# Patient Record
Sex: Male | Born: 1960 | Race: White | Hispanic: No | Marital: Single | Smoking: Never smoker
Health system: Southern US, Community
[De-identification: ages and names within clinical notes are randomized; demographics above are authoritative.]

## PROBLEM LIST (undated history)

## (undated) DIAGNOSIS — I10 Essential (primary) hypertension: Secondary | ICD-10-CM

## (undated) DIAGNOSIS — R000 Tachycardia, unspecified: Secondary | ICD-10-CM

## (undated) DIAGNOSIS — E119 Type 2 diabetes mellitus without complications: Secondary | ICD-10-CM

## (undated) HISTORY — PX: MENISCUS REPAIR: SHX5179

## (undated) HISTORY — PX: HERNIA REPAIR: SHX51

---

## 2005-01-29 ENCOUNTER — Emergency Department: Payer: Self-pay | Admitting: General Practice

## 2005-01-31 ENCOUNTER — Emergency Department: Payer: Self-pay | Admitting: Internal Medicine

## 2005-02-02 ENCOUNTER — Emergency Department: Payer: Self-pay | Admitting: Emergency Medicine

## 2006-11-28 ENCOUNTER — Other Ambulatory Visit: Payer: Self-pay

## 2006-11-29 ENCOUNTER — Inpatient Hospital Stay: Payer: Self-pay | Admitting: Internal Medicine

## 2008-02-12 ENCOUNTER — Ambulatory Visit (HOSPITAL_BASED_OUTPATIENT_CLINIC_OR_DEPARTMENT_OTHER): Admission: RE | Admit: 2008-02-12 | Discharge: 2008-02-12 | Payer: Self-pay | Admitting: Orthopedic Surgery

## 2010-02-09 ENCOUNTER — Emergency Department: Payer: Self-pay | Admitting: Emergency Medicine

## 2010-02-22 ENCOUNTER — Ambulatory Visit: Payer: Self-pay | Admitting: Orthopedic Surgery

## 2010-08-29 ENCOUNTER — Encounter: Admission: RE | Admit: 2010-08-29 | Discharge: 2010-08-29 | Payer: Self-pay | Admitting: Occupational Medicine

## 2011-05-07 NOTE — Op Note (Signed)
Patrick Ramos, Patrick Ramos              ACCOUNT NO.:  [id number]   MEDICAL RECORD NO.:  [id number]          PATIENT TYPE:  AMB   LOCATION:  DSC                          FACILITY:  [facility]   PHYSICIAN:  Harvie Junior, M.D.   DATE OF BIRTH:  1961/06/06   DATE OF PROCEDURE:  02/12/2008  DATE OF DISCHARGE:  02/12/2008                               OPERATIVE REPORT   This is a 50 year old male on Orthopedic Surgery service.   PREOPERATIVE DIAGNOSIS:  Suspected chondral injury, medial.   POSTOPERATIVE DIAGNOSES:  1. Lateral meniscal tear.  2. Chondral injury patellofemoral joint.   PRINCIPAL PROCEDURES:  1. Arthroscopic debridement of partial lateral meniscus.  2. Arthroscopic chondroplasty of patellofemoral joint.  3. Dramatic wash-out with 6 liters of saline.   SURGEON:  Jodi Geralds, MD.   ASSISTANT:  Marshia Ly, PA.   ANESTHESIA:  General.   BRIEF HISTORY:  Patrick Ramos is a 50 year old male with a long history of  having had significant left knee pain.  He had been treated  conservatively for a period of time.  He had injection therapy.  He had  anti-inflammatory medication.  These things had helped but he persisted  with having pain.  He had a previous knee scope which showed some  cartilage irritation and inflammation.  We evaluated him in the office.  X-rays showed good maintenance of joint space.  We felt that he could  have a little bit of chondral injury relative to his previous knee scope  and we felt that it was possible he could have meniscal pathology,  certainly felt that he had significant synovial problems.  He is brought  to the operating room for operative knee arthroscopy after failure of  all conservative care.   PROCEDURE:  The patient taken to the operating room.  After adequate  anesthesia obtained with general anesthetic, the patient placed upon the  operating room table.  He was then placed into a leg holder and the left  leg was prepped and draped in the  usual sterile fashion.  Following this  routine arthroscopic examination of the joint revealed that the medial  compartment showed no significant meniscal abnormality and the medial  femoral cartilage was probed at length, tibial plateau probed at length  and all of this was good.  He did have a significant amount of  crystalline deposition in the knee, this was debrided with a probe and  suctioned out during the case.  Attention was turned to the lateral  compartment.  He did have a little undersurface lateral meniscal tear  which was debrided with a straight-biting forceps.  The remaining  meniscal remnant was contoured down with a suction shaver.  The lateral  compartment had a fair amount of crystalline deposition as well.  Lateral femoral cartilage looked good to probing and to range of motion  and also lateral tibial plateau looked good to probing and range of  motion.  Attention was then turned now to the lateral compartment to the  ACL which was normal.  Attention was turned now away from the ACL and  into  the patellofemoral compartment where the joint space looked good  and the knee at this point was copiously and thoroughly irrigated and  suctioned dry.  The 6L of normal saline were instilled into the knee for  debridement here.  The patellofemoral joint was evaluated, noted to have  midline patellar tracking, no hyperpressure and there was a little bit  of chondral injury at the superior surface of the trochlea which was  debrided by way of chondroplasty.  At this point the 6L of the saline  was washed through the knee with a probe, using to try to debride some  of the crystalline deposition, we went into medial and lateral gutters,  suprapatellar pouch and both the medial and lateral compartments of the  knee.  At this point, the knee was copiously and thoroughly irrigated  and suctioned dry, the portals were closed with a bandage.  A sterile  compressive dressing was applied.   Patient taken to Recovery where he  was noted to be satisfactory condition.   ESTIMATED BLOOD LOSS FOR PROCEDURE:  None.      Harvie Junior, M.D.  Electronically Signed     JLG/MEDQ  D:  02/12/2008  T:  02/12/2008  Job:  [id number]

## 2011-07-22 ENCOUNTER — Emergency Department: Payer: Self-pay | Admitting: Emergency Medicine

## 2011-09-16 LAB — BASIC METABOLIC PANEL
Chloride: 101
GFR calc Af Amer: >60
GFR calc non Af Amer: >60
Potassium: 4.3

## 2011-09-16 LAB — POCT HEMOGLOBIN-HEMACUE: Hemoglobin: 16.4

## 2011-09-19 ENCOUNTER — Emergency Department: Payer: Self-pay | Admitting: Internal Medicine

## 2012-08-21 ENCOUNTER — Ambulatory Visit: Payer: Self-pay | Admitting: Internal Medicine

## 2012-08-23 ENCOUNTER — Ambulatory Visit: Payer: Self-pay | Admitting: Internal Medicine

## 2013-03-01 ENCOUNTER — Ambulatory Visit: Payer: Self-pay | Admitting: Unknown Physician Specialty

## 2014-02-28 ENCOUNTER — Ambulatory Visit: Payer: Self-pay

## 2016-01-26 ENCOUNTER — Other Ambulatory Visit: Payer: Self-pay | Admitting: Internal Medicine

## 2016-01-26 DIAGNOSIS — R1012 Left upper quadrant pain: Secondary | ICD-10-CM

## 2016-01-31 ENCOUNTER — Ambulatory Visit
Admission: RE | Admit: 2016-01-31 | Discharge: 2016-01-31 | Disposition: A | Discharge status: Home / Self Care | Payer: PRIVATE HEALTH INSURANCE | Source: Ambulatory Visit | Attending: Internal Medicine | Admitting: Internal Medicine

## 2016-01-31 DIAGNOSIS — R1012 Left upper quadrant pain: Secondary | ICD-10-CM | POA: Diagnosis present

## 2016-01-31 DIAGNOSIS — R194 Change in bowel habit: Secondary | ICD-10-CM | POA: Diagnosis not present

## 2016-01-31 DIAGNOSIS — K760 Fatty (change of) liver, not elsewhere classified: Secondary | ICD-10-CM | POA: Diagnosis not present

## 2016-01-31 HISTORY — DX: Essential (primary) hypertension: I10

## 2016-01-31 HISTORY — DX: Type 2 diabetes mellitus without complications: E11.9

## 2016-01-31 MED ORDER — IOHEXOL 300 MG/ML  SOLN
100.0000 mL | Freq: Once | INTRAMUSCULAR | Status: AC | PRN
  Administered 2016-01-31: 100 mL via INTRAVENOUS

## 2016-02-08 ENCOUNTER — Other Ambulatory Visit: Payer: Self-pay | Admitting: Nurse Practitioner

## 2016-02-08 DIAGNOSIS — K760 Fatty (change of) liver, not elsewhere classified: Secondary | ICD-10-CM

## 2016-02-12 ENCOUNTER — Ambulatory Visit
Admission: RE | Admit: 2016-02-12 | Discharge: 2016-02-12 | Disposition: A | Discharge status: Home / Self Care | Payer: PRIVATE HEALTH INSURANCE | Source: Ambulatory Visit | Attending: Nurse Practitioner | Admitting: Nurse Practitioner

## 2016-02-12 DIAGNOSIS — K760 Fatty (change of) liver, not elsewhere classified: Secondary | ICD-10-CM

## 2016-10-24 IMAGING — US US LIVER ELASTOGRAPHY
1 series · 13 of 16 positions shown · non-contrast
Comparison: None.

CLINICAL DATA: Fatty liver

EXAM:
ULTRASOUND HEPATIC ELASTOGRAPHY
TECHNIQUE: Ultrasound elastography evaluation of the liver was performed. A
region of interest was placed in the right lobe of the liver.
Following application of a compressive sonographic pulse, shear
waves were detected in the adjacent hepatic tissue and the shear
wave velocity was calculated. Multiple assessments were performed at
the selected site. Median shear wave velocity is correlated to a
Metavir fibrosis score.

[Series 1: us liver elastography · 0.27mm/px · 13 of 16 slices shown]
[im 1/16]
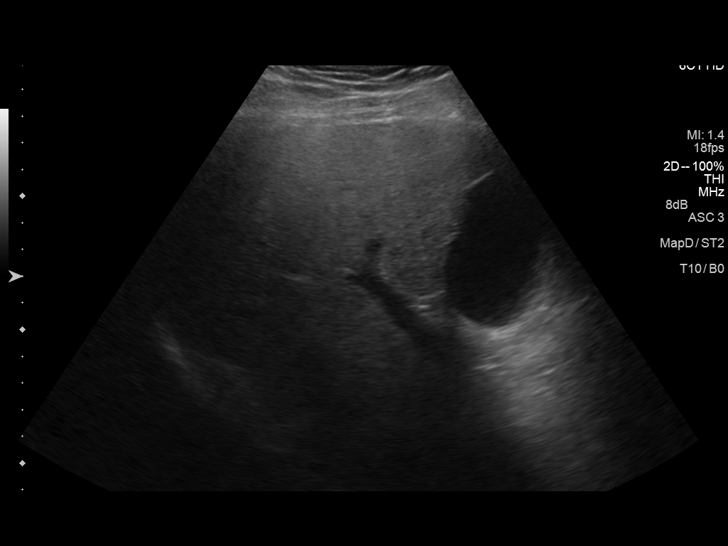
[im 2/16]
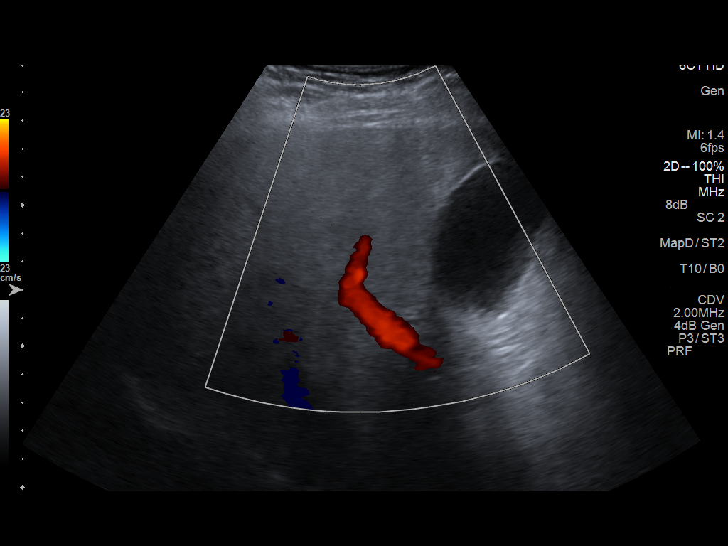
[im 4/16]
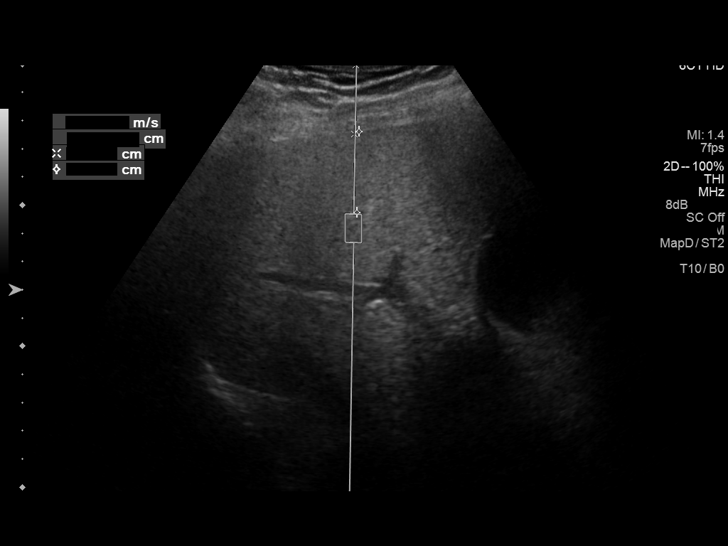
[im 5/16]
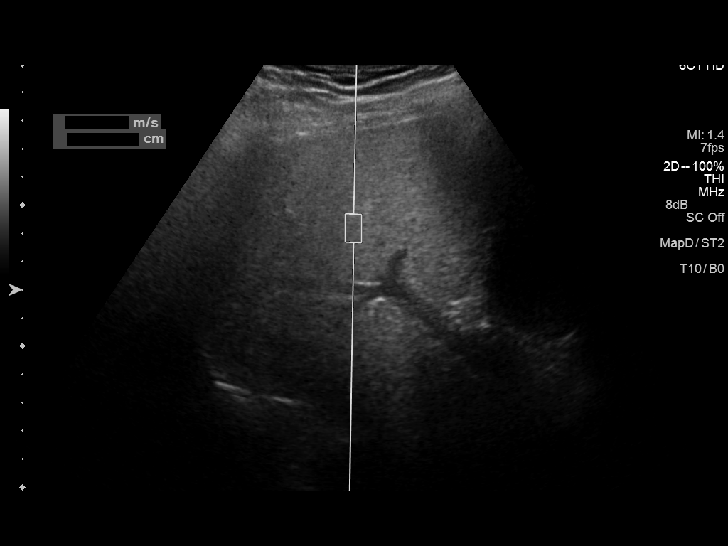
[im 6/16]
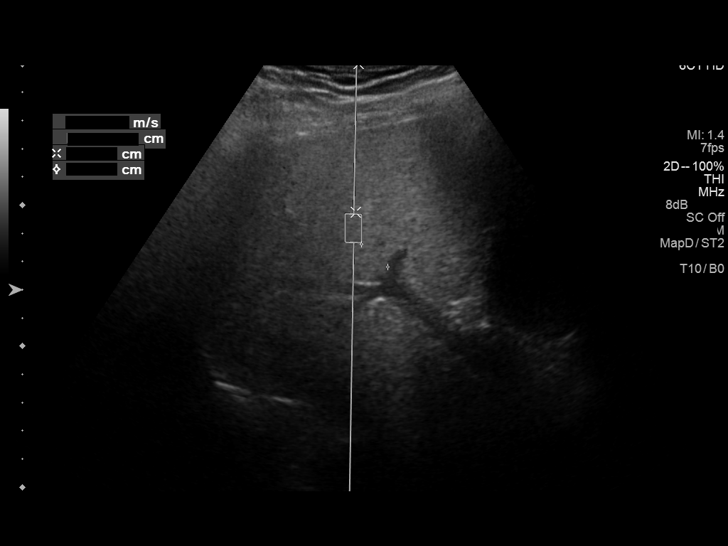
[im 7/16]
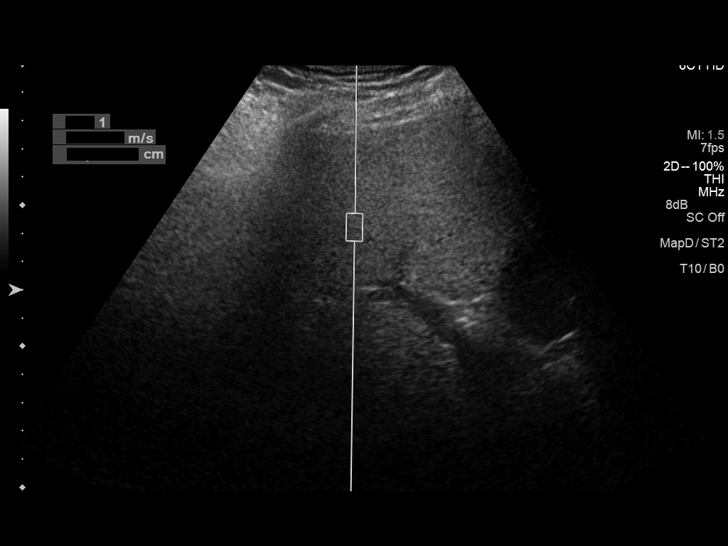
[im 9/16]
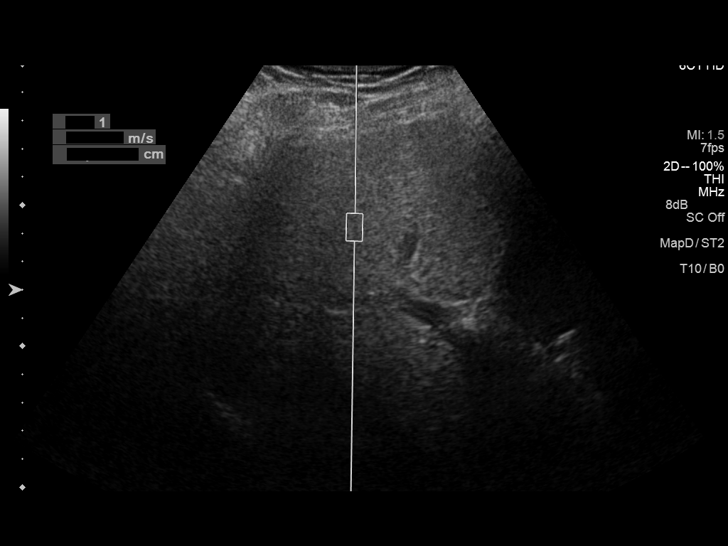
[im 10/16]
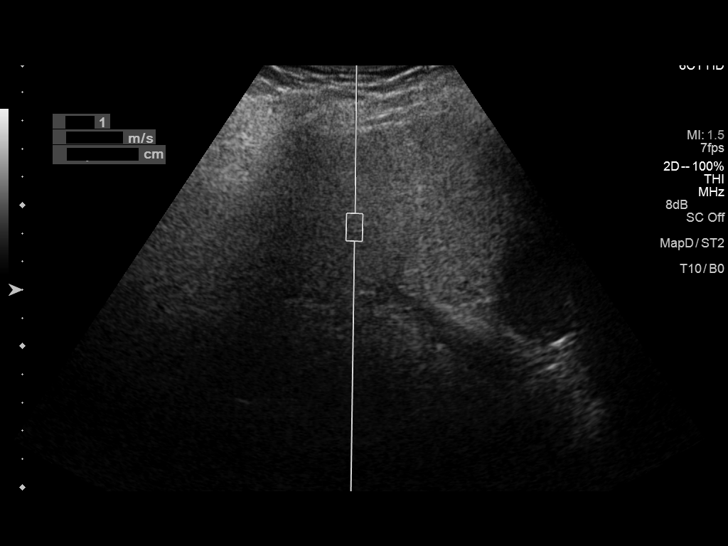
[im 11/16]
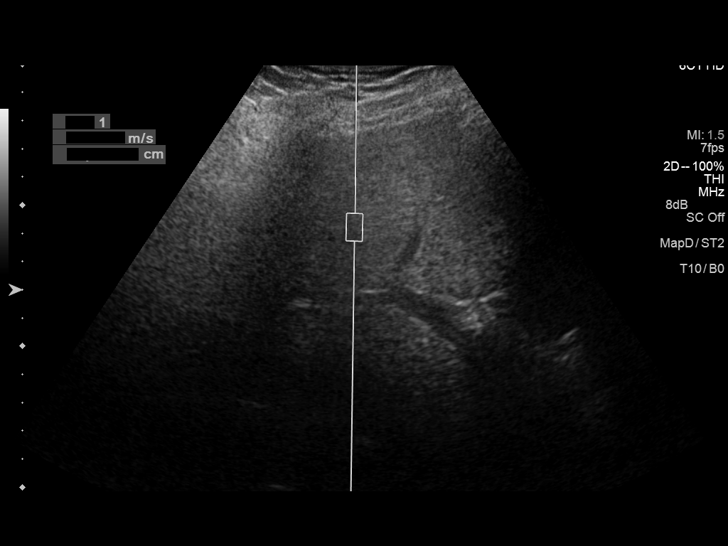
[im 12/16]
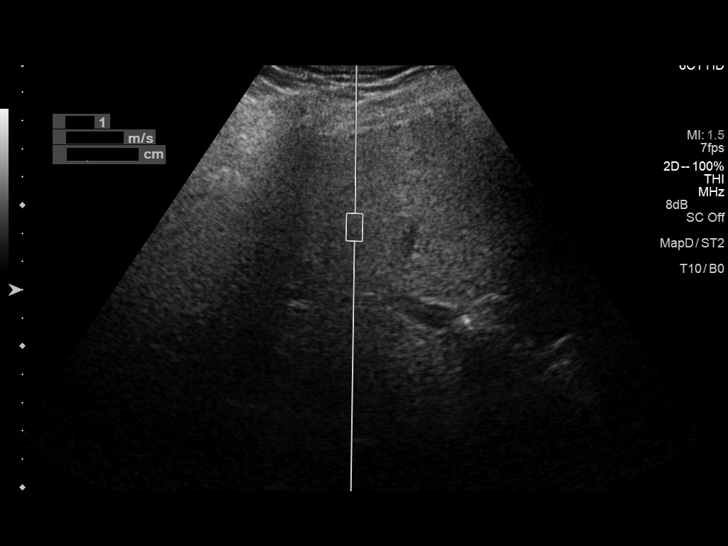
[im 13/16]
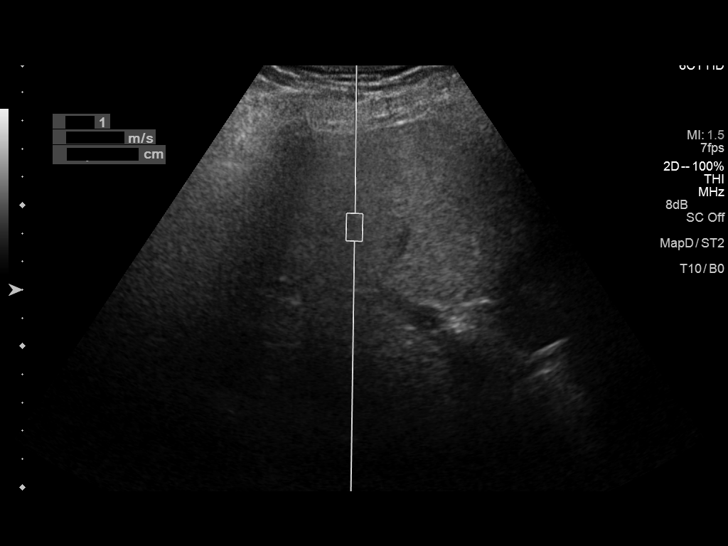
[im 15/16]
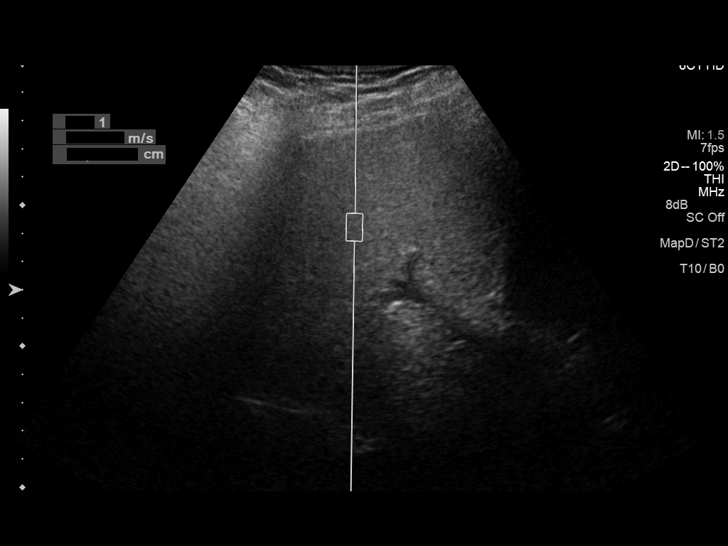
[im 16/16]
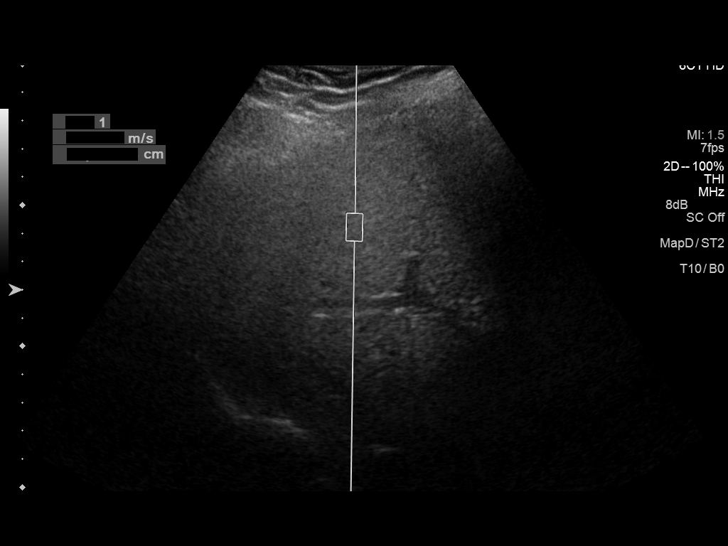

[13 of 16 positions shown; findings below may reference images not displayed]

FINDINGS: Device: Siemens Helix VTQ

Patient position: Supine

Transducer [id number]

Number of measurements: 10

Hepatic segment:  8

Median velocity:   3.12  m/sec

IQR:

IQR/Median velocity ratio:

Corresponding Metavir fibrosis score:  Some F3 + F4

Risk of fibrosis: High

Limitations of exam: Respiratory motion.

Pertinent findings noted on other imaging exams:  None

Please note that abnormal shear wave velocities may also be
identified in clinical settings other than with hepatic fibrosis,
such as: acute hepatitis, elevated right heart and central venous
pressures including use of beta blockers, Uest disease
([place]), infiltrative processes such as
mastocytosis/amyloidosis/infiltrative tumor, extrahepatic
cholestasis, in the post-prandial state, and liver transplantation.
Correlation with patient history, laboratory data, and clinical
condition recommended.
IMPRESSION: Median hepatic shear wave velocity is calculated at 3.12 m/sec.

Corresponding Metavir fibrosis score is  Some F3 + F4.

Risk of fibrosis is High.

Follow-up: Follow up advised.  This

## 2018-05-05 ENCOUNTER — Emergency Department
Admission: EM | Admit: 2018-05-05 | Discharge: 2018-05-05 | Disposition: A | Discharge status: Home / Self Care | Payer: BLUE CROSS/BLUE SHIELD | Attending: Emergency Medicine | Admitting: Emergency Medicine

## 2018-05-05 DIAGNOSIS — Z7984 Long term (current) use of oral hypoglycemic drugs: Secondary | ICD-10-CM | POA: Insufficient documentation

## 2018-05-05 DIAGNOSIS — I10 Essential (primary) hypertension: Secondary | ICD-10-CM | POA: Diagnosis not present

## 2018-05-05 DIAGNOSIS — R739 Hyperglycemia, unspecified: Secondary | ICD-10-CM

## 2018-05-05 DIAGNOSIS — E1165 Type 2 diabetes mellitus with hyperglycemia: Secondary | ICD-10-CM | POA: Diagnosis not present

## 2018-05-05 HISTORY — DX: Tachycardia, unspecified: R00.0

## 2018-05-05 LAB — URINALYSIS, COMPLETE (UACMP) WITH MICROSCOPIC
Bacteria, UA: NONE SEEN
Bilirubin Urine: NEGATIVE
Glucose, UA: >=500 mg/dL — AB
Hgb urine dipstick: NEGATIVE
KETONES UR: NEGATIVE mg/dL
LEUKOCYTES UA: NEGATIVE
Nitrite: NEGATIVE
PH: 7 (ref 5.0–8.0)
Protein, ur: NEGATIVE mg/dL
SQUAMOUS EPITHELIAL / LPF: NONE SEEN (ref 0–5)
Specific Gravity, Urine: 1.001 — ABNORMAL LOW (ref 1.005–1.030)
WBC, UA: NONE SEEN WBC/hpf (ref 0–5)

## 2018-05-05 LAB — BASIC METABOLIC PANEL
Anion gap: 7 (ref 5–15)
BUN: 11 mg/dL (ref 6–20)
CHLORIDE: 99 mmol/L — AB (ref 101–111)
CO2: 25 mmol/L (ref 22–32)
CREATININE: 0.91 mg/dL (ref 0.61–1.24)
Calcium: 8.7 mg/dL — ABNORMAL LOW (ref 8.9–10.3)
GFR calc Af Amer: >60 mL/min (ref >60)
GFR calc non Af Amer: >60 mL/min (ref >60)
GLUCOSE: 438 mg/dL — AB (ref 65–99)
POTASSIUM: 4.3 mmol/L (ref 3.5–5.1)
SODIUM: 131 mmol/L — AB (ref 135–145)

## 2018-05-05 LAB — GLUCOSE, CAPILLARY
GLUCOSE-CAPILLARY: 405 mg/dL — AB (ref 65–99)
GLUCOSE-CAPILLARY: 205 mg/dL — AB (ref 65–99)

## 2018-05-05 LAB — CBC
HEMATOCRIT: 45.4 % (ref 40.0–52.0)
Hemoglobin: 15.9 g/dL (ref 13.0–18.0)
MCH: 33.3 pg (ref 26.0–34.0)
MCHC: 35 g/dL (ref 32.0–36.0)
MCV: 95.4 fL (ref 80.0–100.0)
PLATELETS: 153 10*3/uL (ref 150–440)
RBC: 4.76 MIL/uL (ref 4.40–5.90)
RDW: 13.8 % (ref 11.5–14.5)
WBC: 7.1 10*3/uL (ref 3.8–10.6)

## 2018-05-05 MED ORDER — SODIUM CHLORIDE 0.9 % IV BOLUS
1000.0000 mL | Freq: Once | INTRAVENOUS | Status: AC
  Administered 2018-05-05: 1000 mL via INTRAVENOUS

## 2018-05-05 NOTE — ED Triage Notes (Signed)
To ER c/o high blood sugars since yesterday. Type 2 diabetic. Pt alert and oriented X4, active, cooperative, pt in NAD. RR even and unlabored, color WNL.  Denies CP SOB confusion.

## 2018-05-05 NOTE — Discharge Instructions (Addendum)
Return to the emergency room for any new or worsening symptoms including nausea vomiting diarrhea, chest pain shortness of breath or if you worsen anyway.  Drink plenty of fluids, avoid excess carbohydrates and sugars, and follow closely with your doctor take your glucose medication as scribed.

## 2018-05-05 NOTE — ED Provider Notes (Addendum)
[facility] Emergency Department Provider Note  ____________________________________________   I have reviewed the triage vital signs and the nursing notes. Where available I have reviewed prior notes and, if possible and indicated, outside hospital notes.    HISTORY  Chief Complaint Hyperglycemia    HPI Patrick Ramos is a 57 y.o. male who presents today complaining of elevated blood sugar.  Patient has been on glyburide for years.  He has not had any change in stress as he does not regularly check his sugar, sometimes as he was a few times a week.  He has absolutely no symptoms otherwise, he just noticed that his sugar was high.  He denies chest pain short of breath nausea vomiting abdominal pain he denies foot or groin or other skin rash.  No dysuria he does tend to eat a little more frequently when the sugar is high he has no flank pain, he has no fevers no diarrhea, he has no exertional symptoms he has no chest pain he has absolutely no symptoms aside from the fact that he knows to sugar was high.  Does state that he is under increased stress recently because he lost his job a month ago he has no SI or HI.  He did have he thinks some extra food today.  Past Medical History:  Diagnosis Date  . Diabetes mellitus without complication (HCC)   . Hypertension   . Tachycardia     There are no active problems to display for this patient.   History reviewed. No pertinent surgical history.  Prior to Admission medications   Not on File    Allergies Oxycodone-acetaminophen  No family history on file.  Social History Social History   Tobacco Use  . Smoking status: Never Smoker  Substance Use Topics  . Alcohol use: Not Currently  . Drug use: Not on file    Review of Systems Constitutional: No fever/chills Eyes: No visual changes. ENT: No sore throat. No stiff neck no neck pain Cardiovascular: Denies chest pain. Respiratory: Denies shortness of  breath. Gastrointestinal:   no vomiting.  No diarrhea.  No constipation. Genitourinary: Negative for dysuria. Musculoskeletal: Negative lower extremity swelling Skin: Negative for rash. Neurological: Negative for severe headaches, focal weakness or numbness.   ____________________________________________   PHYSICAL EXAM:  VITAL SIGNS: ED Triage Vitals  Enc Vitals Group     BP 05/05/18 1553 (!) 143/76     Pulse Rate 05/05/18 1553 (!) 58     Resp 05/05/18 1553 18     Temp 05/05/18 1820 98.2 F (36.8 C)     Temp Source 05/05/18 1553 Oral     SpO2 05/05/18 1553 98 %     Weight 05/05/18 1553 175 lb (79.4 kg)     Height 05/05/18 1553  (1.676 m)     Head Circumference --      Peak Flow --      Pain Score 05/05/18 1553 0     Pain Loc --      Pain Edu? --      Excl. in GC? --     Constitutional: Alert and oriented. Well appearing and in no acute distress. Eyes: Conjunctivae are normal Head: Atraumatic HEENT: No congestion/rhinnorhea. Mucous membranes are moist.  Oropharynx non-erythematous Neck:   Nontender with no meningismus, no masses, no stridor Cardiovascular: Normal rate, regular rhythm. Grossly normal heart sounds.  Good peripheral circulation. Respiratory: Normal respiratory effort.  No retractions. Lungs CTAB. Abdominal: Soft and nontender. No  distention. No guarding no rebound Back:  There is no focal tenderness or step off.  there is no midline tenderness there are no lesions noted. there is no CVA tenderness Fournier's gangrene not present, normal GU Musculoskeletal: No lower extremity tenderness, no upper extremity tenderness. No joint effusions, no DVT signs strong distal pulses no edema Neurologic:  Normal speech and language. No gross focal neurologic deficits are appreciated.  Skin:  Skin is warm, dry and intact. No rash noted. Psychiatric: Mood and affect are normal. Speech and behavior are normal.  ____________________________________________    LABS (all labs ordered are listed, but only abnormal results are displayed)  Labs Reviewed  BASIC METABOLIC PANEL - Abnormal; Notable for the following components:      Result Value   Sodium 131 (*)    Chloride 99 (*)    Glucose, Bld 438 (*)    Calcium 8.7 (*)    All other components within normal limits  GLUCOSE, CAPILLARY - Abnormal; Notable for the following components:   Glucose-Capillary 405 (*)    All other components within normal limits  CBC  URINALYSIS, COMPLETE (UACMP) WITH MICROSCOPIC  CBG MONITORING, ED    Pertinent labs  results that were available during my care of the patient were reviewed by me and considered in my medical decision making (see chart for details). ____________________________________________  EKG  I personally interpreted any EKGs ordered by me or triage Sinus bradycardia rate 54 no acute ST elevation or depression, no significant axis deviation, normal EKG aside from mild bradycardia and borderline LVH. ____________________________________________  RADIOLOGY  Pertinent labs & imaging results that were available during my care of the patient were reviewed by me and considered in my medical decision making (see chart for details). If possible, patient and/or family made aware of any abnormal findings.  No results found. ____________________________________________    PROCEDURES  Procedure(s) performed: None  Procedures  Critical Care performed: None  ____________________________________________   INITIAL IMPRESSION / ASSESSMENT AND PLAN / ED COURSE  Pertinent labs & imaging results that were available during my care of the patient were reviewed by me and considered in my medical decision making (see chart for details).  Patient no acute distress here with elevated sugar, most likely this is stress-induced/diet related however we will give him a liter of fluid and we will check a urinalysis looking for signs of occult infection we  will do an EKG and if all is reassuring we will try to get him home.    ____________________________________________   FINAL CLINICAL IMPRESSION(S) / ED DIAGNOSES  Final diagnoses:  None      This chart was dictated using voice recognition software.  Despite best efforts to proofread,  errors can occur which can change meaning.      Jeanmarie Plant, MD 05/05/18 1851    Jeanmarie Plant, MD 05/05/18 [phone]

## 2018-05-07 ENCOUNTER — Encounter: Payer: Self-pay | Admitting: Emergency Medicine

## 2018-05-07 ENCOUNTER — Emergency Department
Admission: EM | Admit: 2018-05-07 | Discharge: 2018-05-07 | Disposition: A | Discharge status: Home / Self Care | Payer: BLUE CROSS/BLUE SHIELD | Attending: Emergency Medicine | Admitting: Emergency Medicine

## 2018-05-07 ENCOUNTER — Other Ambulatory Visit: Payer: Self-pay

## 2018-05-07 DIAGNOSIS — I10 Essential (primary) hypertension: Secondary | ICD-10-CM | POA: Insufficient documentation

## 2018-05-07 DIAGNOSIS — E1165 Type 2 diabetes mellitus with hyperglycemia: Secondary | ICD-10-CM | POA: Diagnosis not present

## 2018-05-07 DIAGNOSIS — R739 Hyperglycemia, unspecified: Secondary | ICD-10-CM

## 2018-05-07 DIAGNOSIS — K0889 Other specified disorders of teeth and supporting structures: Secondary | ICD-10-CM | POA: Insufficient documentation

## 2018-05-07 LAB — CBC
HCT: 46.2 % (ref 40.0–52.0)
HEMOGLOBIN: 16 g/dL (ref 13.0–18.0)
MCH: 32.8 pg (ref 26.0–34.0)
MCHC: 34.7 g/dL (ref 32.0–36.0)
MCV: 94.4 fL (ref 80.0–100.0)
Platelets: 149 10*3/uL — ABNORMAL LOW (ref 150–440)
RBC: 4.89 MIL/uL (ref 4.40–5.90)
RDW: 13.7 % (ref 11.5–14.5)
WBC: 7.6 10*3/uL (ref 3.8–10.6)

## 2018-05-07 LAB — BASIC METABOLIC PANEL
ANION GAP: 9 (ref 5–15)
BUN: 12 mg/dL (ref 6–20)
CALCIUM: 8.6 mg/dL — AB (ref 8.9–10.3)
CO2: 22 mmol/L (ref 22–32)
Chloride: 99 mmol/L — ABNORMAL LOW (ref 101–111)
Creatinine, Ser: 0.91 mg/dL (ref 0.61–1.24)
GFR calc Af Amer: >60 mL/min (ref >60)
GLUCOSE: 372 mg/dL — AB (ref 65–99)
Potassium: 4.1 mmol/L (ref 3.5–5.1)
Sodium: 130 mmol/L — ABNORMAL LOW (ref 135–145)

## 2018-05-07 LAB — URINALYSIS, COMPLETE (UACMP) WITH MICROSCOPIC
BACTERIA UA: NONE SEEN
BILIRUBIN URINE: NEGATIVE
Glucose, UA: >=500 mg/dL — AB
Hgb urine dipstick: NEGATIVE
KETONES UR: NEGATIVE mg/dL
Leukocytes, UA: NEGATIVE
NITRITE: NEGATIVE
PROTEIN: NEGATIVE mg/dL
Specific Gravity, Urine: 1.002 — ABNORMAL LOW (ref 1.005–1.030)
Squamous Epithelial / LPF: NONE SEEN (ref 0–5)
WBC UA: NONE SEEN WBC/hpf (ref 0–5)
pH: 6 (ref 5.0–8.0)

## 2018-05-07 LAB — GLUCOSE, CAPILLARY
Glucose-Capillary: 363 mg/dL — ABNORMAL HIGH (ref 65–99)
Glucose-Capillary: 289 mg/dL — ABNORMAL HIGH (ref 65–99)
Glucose-Capillary: 257 mg/dL — ABNORMAL HIGH (ref 65–99)

## 2018-05-07 LAB — TROPONIN I

## 2018-05-07 MED ORDER — PENICILLIN V POTASSIUM 500 MG PO TABS
500.0000 mg | ORAL_TABLET | Freq: Once | ORAL | Status: AC
  Administered 2018-05-07: 500 mg via ORAL
  Filled 2018-05-07: qty 2
  Filled 2018-05-07: qty 1

## 2018-05-07 MED ORDER — SODIUM CHLORIDE 0.9 % IV BOLUS
1000.0000 mL | Freq: Once | INTRAVENOUS | Status: AC
  Administered 2018-05-07: 1000 mL via INTRAVENOUS

## 2018-05-07 MED ORDER — GLIPIZIDE 10 MG PO TABS: 10.0000 mg | ORAL_TABLET | Freq: Two times a day (BID) | ORAL | 0 refills | Status: DC

## 2018-05-07 MED ORDER — PENICILLIN V POTASSIUM 500 MG PO TABS: 500.0000 mg | ORAL_TABLET | Freq: Four times a day (QID) | ORAL | 0 refills | Status: DC

## 2018-05-07 NOTE — ED Provider Notes (Signed)
[facility] Emergency Department Provider Note  Time seen: 7:40 PM  I have reviewed the triage vital signs and the nursing notes.   HISTORY  Chief Complaint Hyperglycemia    HPI JOB Patrick Ramos is a 57 y.o. male with a past medical history of diabetes, hypertension, tachycardia, presents to the emergency department for elevated blood glucose.  According to the patient for the past several weeks he has had a lot of difficulty controlling his blood glucose.  He takes his blood glucose several times each day and states for the past several weeks has been elevated in the 200s and 300 and now 400s.  Patient denies any abdominal pain, states in review of systems last week he had a brief episode of chest discomfort but has not had any since.  Denies any nausea, vomiting, diarrhea, dysuria or other infectious symptoms.  He does state mild right dental pain.  Past Medical History:  Diagnosis Date  . Diabetes mellitus without complication (HCC)   . Hypertension   . Tachycardia     There are no active problems to display for this patient.   History reviewed. No pertinent surgical history.  Prior to Admission medications   Not on File    Allergies  Allergen Reactions  . Oxycodone-Acetaminophen Other (See Comments)         No family history on file.  Social History Social History   Tobacco Use  . Smoking status: Never Smoker  . Smokeless tobacco: Never Used  Substance Use Topics  . Alcohol use: Not Currently  . Drug use: Never    Review of Systems Constitutional: Negative for fever. Eyes: Negative for visual complaints ENT: Mild right lower dental pain. Cardiovascular: Negative for chest pain. Respiratory: Negative for shortness of breath. Gastrointestinal: Negative for abdominal pain, vomiting and diarrhea. Genitourinary: Negative for urinary compaints Musculoskeletal: Negative for musculoskeletal complaints Skin: Negative for skin complaints   Neurological: Negative for headache All other ROS negative  ____________________________________________   PHYSICAL EXAM:  VITAL SIGNS: ED Triage Vitals  Enc Vitals Group     BP 05/07/18 1756 (!) 148/93     Pulse Rate 05/07/18 1756 (!) 56     Resp 05/07/18 1756 16     Temp 05/07/18 1756 98.2 F (36.8 C)     Temp Source 05/07/18 1756 Oral     SpO2 05/07/18 1756 99 %     Weight 05/07/18 1758 182 lb (82.6 kg)     Height 05/07/18 1758  (1.676 m)     Head Circumference --      Peak Flow --      Pain Score 05/07/18 1757 3     Pain Loc --      Pain Edu? --      Excl. in GC? --    Constitutional: Alert and oriented. Well appearing and in no distress. Eyes: Normal exam ENT   Head: Normocephalic and atraumatic.   Mouth/Throat: Mucous membranes are moist.  Poor dentition of the right lower molar, no sign of abscess. Cardiovascular: Normal rate, regular rhythm. No murmur Respiratory: Normal respiratory effort without tachypnea nor retractions. Breath sounds are clear  Gastrointestinal: Soft and nontender. No distention.  Musculoskeletal: Nontender with normal range of motion in all extremities.  Neurologic:  Normal speech and language. No gross focal neurologic deficits  Skin:  Skin is warm, dry and intact.  Psychiatric: Mood and affect are normal.   ____________________________________________    INITIAL IMPRESSION / ASSESSMENT AND PLAN /  ED COURSE  Pertinent labs & imaging results that were available during my care of the patient were reviewed by me and considered in my medical decision making (see chart for details).  Patient presents to the emergency department for elevated blood glucose.  Largely negative review of systems with largely normal exam.  I reviewed the patient's past primary care notes he was at one point taking glipizide 20 mg daily although the patient states currently he is only taking 10 mg daily.  This very likely could be the cause of the  patient's hyperglycemia is ineffective diabetic medication.  Patient also has some dental discomfort although no signs of abscess on examination could also be a cause of hyperglycemia.  We will cover with penicillin.  Patient is very diligent about taking his blood sugar several times per day.  I believe he would be a good candidate to increase his glipizide from 10 mg once daily to 10 mg twice daily I discussed with the patient taking it once before breakfast and once before dinner and making sure he checks his blood glucose before going to bed.  Patient agreeable to this plan of care.  The patient did mention a brief episode of chest discomfort lasting several minutes last week I have added on a troponin as a precaution.  Overall the patient appears well denies any chest pain currently no shortness of breath cough congestion nausea or diaphoresis.  The remainder the patient's blood work is largely within normal limits besides mild hyponatremia due to pseudohyponatremia of hyperglycemia.  Blood sugar is now down to 257.  We will discharge patient with glipizide twice daily follow-up with his primary care doctor for recheck/reevaluation.  Patient agreeable to this plan of care.  ____________________________________________   FINAL CLINICAL IMPRESSION(S) / ED DIAGNOSES  Upper glycemia Dental pain    Minna Antis, MD 05/07/18 2102

## 2018-05-07 NOTE — ED Triage Notes (Signed)
Pt in via POV with complaints of hyperglycemia and headache.  Pt seen here for same on Tuesday, unable to keep blood sugar below 300 since going home.  Pt reports being unable to get in with PCP due to outstanding bill.  Vitals WDL. NAD noted at this time.

## 2019-04-30 ENCOUNTER — Emergency Department
Admission: EM | Admit: 2019-04-30 | Discharge: 2019-04-30 | Disposition: A | Discharge status: Home / Self Care | Payer: No Typology Code available for payment source | Attending: Emergency Medicine | Admitting: Emergency Medicine

## 2019-04-30 ENCOUNTER — Encounter: Payer: Self-pay | Admitting: Emergency Medicine

## 2019-04-30 ENCOUNTER — Other Ambulatory Visit: Payer: Self-pay

## 2019-04-30 ENCOUNTER — Emergency Department: Payer: No Typology Code available for payment source

## 2019-04-30 DIAGNOSIS — E119 Type 2 diabetes mellitus without complications: Secondary | ICD-10-CM | POA: Insufficient documentation

## 2019-04-30 DIAGNOSIS — R1032 Left lower quadrant pain: Secondary | ICD-10-CM

## 2019-04-30 DIAGNOSIS — I10 Essential (primary) hypertension: Secondary | ICD-10-CM | POA: Insufficient documentation

## 2019-04-30 DIAGNOSIS — K5792 Diverticulitis of intestine, part unspecified, without perforation or abscess without bleeding: Secondary | ICD-10-CM | POA: Insufficient documentation

## 2019-04-30 DIAGNOSIS — Z7984 Long term (current) use of oral hypoglycemic drugs: Secondary | ICD-10-CM | POA: Insufficient documentation

## 2019-04-30 LAB — CBC
HCT: 46.7 % (ref 39.0–52.0)
Hemoglobin: 16.5 g/dL (ref 13.0–17.0)
MCH: 32 pg (ref 26.0–34.0)
MCHC: 35.3 g/dL (ref 30.0–36.0)
MCV: 90.5 fL (ref 80.0–100.0)
Platelets: 137 10*3/uL — ABNORMAL LOW (ref 150–400)
RBC: 5.16 MIL/uL (ref 4.22–5.81)
RDW: 12.9 % (ref 11.5–15.5)
WBC: 12.1 10*3/uL — ABNORMAL HIGH (ref 4.0–10.5)
nRBC: 0 % (ref 0.0–0.2)

## 2019-04-30 LAB — COMPREHENSIVE METABOLIC PANEL
ALT: 35 U/L (ref 0–44)
AST: 28 U/L (ref 15–41)
Albumin: 3.9 g/dL (ref 3.5–5.0)
Alkaline Phosphatase: 98 U/L (ref 38–126)
Anion gap: 11 (ref 5–15)
BUN: 15 mg/dL (ref 6–20)
CO2: 21 mmol/L — ABNORMAL LOW (ref 22–32)
Calcium: 8.7 mg/dL — ABNORMAL LOW (ref 8.9–10.3)
Chloride: 98 mmol/L (ref 98–111)
Creatinine, Ser: 0.96 mg/dL (ref 0.61–1.24)
GFR calc Af Amer: >60 mL/min (ref >60)
GFR calc non Af Amer: >60 mL/min (ref >60)
Glucose, Bld: 257 mg/dL — ABNORMAL HIGH (ref 70–99)
Potassium: 4.1 mmol/L (ref 3.5–5.1)
Sodium: 130 mmol/L — ABNORMAL LOW (ref 135–145)
Total Bilirubin: 2.4 mg/dL — ABNORMAL HIGH (ref 0.3–1.2)
Total Protein: 8.2 g/dL — ABNORMAL HIGH (ref 6.5–8.1)

## 2019-04-30 LAB — URINALYSIS, COMPLETE (UACMP) WITH MICROSCOPIC
Bacteria, UA: NONE SEEN
Bilirubin Urine: NEGATIVE
Glucose, UA: >=500 mg/dL — AB
Hgb urine dipstick: NEGATIVE
Ketones, ur: 20 mg/dL — AB
Leukocytes,Ua: NEGATIVE
Nitrite: NEGATIVE
Protein, ur: NEGATIVE mg/dL
Specific Gravity, Urine: 1.028 (ref 1.005–1.030)
pH: 6 (ref 5.0–8.0)

## 2019-04-30 LAB — LIPASE, BLOOD: Lipase: 26 U/L (ref 11–51)

## 2019-04-30 MED ORDER — IOHEXOL 300 MG/ML  SOLN
100.0000 mL | Freq: Once | INTRAMUSCULAR | Status: AC | PRN
  Administered 2019-04-30: 14:00:00 100 mL via INTRAVENOUS

## 2019-04-30 MED ORDER — CIPROFLOXACIN HCL 500 MG PO TABS: 500.0000 mg | ORAL_TABLET | Freq: Two times a day (BID) | ORAL | 0 refills | Status: DC

## 2019-04-30 MED ORDER — SODIUM CHLORIDE 0.9 % IV BOLUS: 1000.0000 mL | Freq: Once | INTRAVENOUS | Status: DC

## 2019-04-30 MED ORDER — ONDANSETRON HCL 4 MG/2ML IJ SOLN
4.0000 mg | Freq: Once | INTRAMUSCULAR | Status: AC
  Administered 2019-04-30: 4 mg via INTRAVENOUS
  Filled 2019-04-30: qty 2

## 2019-04-30 MED ORDER — MORPHINE SULFATE (PF) 4 MG/ML IV SOLN
4.0000 mg | Freq: Once | INTRAVENOUS | Status: AC
  Administered 2019-04-30: 4 mg via INTRAVENOUS
  Filled 2019-04-30: qty 1

## 2019-04-30 MED ORDER — METRONIDAZOLE 500 MG PO TABS: 500.0000 mg | ORAL_TABLET | Freq: Two times a day (BID) | ORAL | 0 refills | Status: DC

## 2019-04-30 NOTE — ED Provider Notes (Signed)
[facility] Emergency Department Provider Note  ____________________________________________  Time seen: Approximately 2:02 PM  I have reviewed the triage vital signs and the nursing notes.   HISTORY  Chief Complaint Abdominal Pain    HPI Patrick Ramos is a 58 y.o. male with a history of hypertension diabetes and diverticulitis who complains of left lower quadrant abdominal pain worsening over the past 2 days.  Constant, no aggravating or alleviating factors.  Radiates to the back.  Severe.  Denies dysuria frequency urgency hematuria.  No diarrhea constipation or vomiting.  No fevers chills or body aches.   He has had decreased oral intake over the past 2 days due to the pain.  Past Medical History:  Diagnosis Date  . Diabetes mellitus without complication (HCC)   . Hypertension   . Tachycardia      There are no active problems to display for this patient.    History reviewed. No pertinent surgical history.   Prior to Admission medications   Medication Sig Start Date End Date Taking? Authorizing Provider  glipiZIDE (GLUCOTROL) 10 MG tablet Take 1 tablet (10 mg total) by mouth 2 (two) times daily. 05/07/18 05/07/19  Minna Antis, MD  penicillin v potassium (VEETID) 500 MG tablet Take 1 tablet (500 mg total) by mouth 4 (four) times daily. 05/07/18   Minna Antis, MD     Allergies Oxycodone-acetaminophen   No family history on file.  Social History Social History   Tobacco Use  . Smoking status: Never Smoker  . Smokeless tobacco: Never Used  Substance Use Topics  . Alcohol use: Not Currently  . Drug use: Never    Review of Systems  Constitutional:   No fever or chills.  ENT:   No sore throat. No rhinorrhea. Cardiovascular:   No chest pain or syncope. Respiratory:   No dyspnea or cough. Gastrointestinal: Positive as above for abdominal pain without vomiting and diarrhea.  Musculoskeletal:   Negative for focal pain or  swelling All other systems reviewed and are negative except as documented above in ROS and HPI.  ____________________________________________   PHYSICAL EXAM:  VITAL SIGNS: ED Triage Vitals [04/30/19 1223]  Enc Vitals Group     BP (!) 145/73     Pulse Rate 78     Resp 16     Temp 98.7 F (37.1 C)     Temp Source Oral     SpO2 96 %     Weight 165 lb (74.8 kg)     Height 5\' 6"  (1.676 m)     Head Circumference      Peak Flow      Pain Score 4     Pain Loc      Pain Edu?      Excl. in [place]?     Vital signs reviewed, nursing assessments reviewed.   Constitutional:   Alert and oriented. Non-toxic appearance. Eyes:   Conjunctivae are normal. EOMI. PERRL. ENT      Head:   Normocephalic and atraumatic.      Nose:   No congestion/rhinnorhea.       Mouth/Throat:   Dry mucous membranes, no pharyngeal erythema. No peritonsillar mass.       Neck:   No meningismus. Full ROM. Hematological/Lymphatic/Immunilogical:   No cervical lymphadenopathy. Cardiovascular:   RRR. Symmetric bilateral radial and DP pulses.  No murmurs. Cap refill less than 2 seconds. Respiratory:   Normal respiratory effort without tachypnea/retractions. Breath sounds are clear and equal bilaterally. No  wheezes/rales/rhonchi. Gastrointestinal:   Soft with diffuse left-sided abdominal tenderness with guarding. Non distended. There is no CVA tenderness.  No rebound or rigidity. Musculoskeletal:   Normal range of motion in all extremities. No joint effusions.  No lower extremity tenderness.  No edema. Neurologic:   Normal speech and language.  Motor grossly intact. No acute focal neurologic deficits are appreciated.  Skin:    Skin is warm, dry and intact. No rash noted.  No petechiae, purpura, or bullae.  ____________________________________________    LABS (pertinent positives/negatives) (all labs ordered are listed, but only abnormal results are displayed) Labs Reviewed  COMPREHENSIVE METABOLIC PANEL - Abnormal;  Notable for the following components:      Result Value   Sodium 130 (*)    CO2 21 (*)    Glucose, Bld 257 (*)    Calcium 8.7 (*)    Total Protein 8.2 (*)    Total Bilirubin 2.4 (*)    All other components within normal limits  CBC - Abnormal; Notable for the following components:   WBC 12.1 (*)    Platelets 137 (*)    All other components within normal limits  URINALYSIS, COMPLETE (UACMP) WITH MICROSCOPIC - Abnormal; Notable for the following components:   Color, Urine YELLOW (*)    APPearance CLEAR (*)    Glucose, UA >=500 (*)    Ketones, ur 20 (*)    All other components within normal limits  LIPASE, BLOOD   ____________________________________________   EKG    ____________________________________________    RADIOLOGY  No results found.  ____________________________________________   PROCEDURES Procedures  ____________________________________________  DIFFERENTIAL DIAGNOSIS   Diverticulitis, abdominal perforation, intra-abdominal abscess, bowel obstruction  CLINICAL IMPRESSION / ASSESSMENT AND PLAN / ED COURSE  Medications ordered in the ED: Medications  ondansetron (ZOFRAN) injection 4 mg (has no administration in time range)  morphine 4 MG/ML injection 4 mg (has no administration in time range)  sodium chloride 0.9 % bolus 1,000 mL (has no administration in time range)    Pertinent labs & imaging results that were available during my care of the patient were reviewed by me and considered in my medical decision making (see chart for details).  Patrick Ramos was evaluated in Emergency Department on 04/30/2019 for the symptoms described in the history of present illness. He was evaluated in the context of the global COVID-19 pandemic, which necessitated consideration that the patient might be at risk for infection with the SARS-CoV-2 virus that causes COVID-19. Institutional protocols and algorithms that pertain to the evaluation of patients at risk for  COVID-19 are in a state of rapid change based on information released by regulatory bodies including the CDC and federal and state organizations. These policies and algorithms were followed during the patient's care in the ED.   Diabetic patient presents with abdominal tenderness and guarding.  Worried about complicated diverticulitis primarily.  Will get CT scan of the abdomen and pelvis, give IV morphine 4 mg and IV Zofran 4 mg for initial symptom relief.  IV fluids for hydration.      ____________________________________________   FINAL CLINICAL IMPRESSION(S) / ED DIAGNOSES    Final diagnoses:  Left lower quadrant abdominal pain  Diverticulitis     ED Discharge Orders    None      Portions of this note were generated with dragon dictation software. Dictation errors may occur despite best attempts at proofreading.   Sharman CheekStafford, Harsimran Westman, MD 04/30/19 [phone]

## 2019-04-30 NOTE — ED Triage Notes (Signed)
Patient presents to the ED with severe left lower quadrant abdominal pain.  Patient reports history of diverticulitis but states this pain is much more severe.  Patient states pain started on Wednesday but has steadily increased.  Patient denies nausea and vomiting reports feeling somewhat constipated.  Patient reports slight tenderness on palpation.

## 2021-03-31 ENCOUNTER — Emergency Department: Payer: No Typology Code available for payment source

## 2021-03-31 ENCOUNTER — Other Ambulatory Visit: Payer: Self-pay

## 2021-03-31 ENCOUNTER — Emergency Department
Admission: EM | Admit: 2021-03-31 | Discharge: 2021-03-31 | Disposition: A | Discharge status: Home / Self Care | Payer: No Typology Code available for payment source | Attending: Emergency Medicine | Admitting: Emergency Medicine

## 2021-03-31 DIAGNOSIS — I10 Essential (primary) hypertension: Secondary | ICD-10-CM | POA: Diagnosis not present

## 2021-03-31 DIAGNOSIS — E119 Type 2 diabetes mellitus without complications: Secondary | ICD-10-CM | POA: Diagnosis not present

## 2021-03-31 DIAGNOSIS — Z7984 Long term (current) use of oral hypoglycemic drugs: Secondary | ICD-10-CM | POA: Insufficient documentation

## 2021-03-31 DIAGNOSIS — R1032 Left lower quadrant pain: Secondary | ICD-10-CM | POA: Diagnosis not present

## 2021-03-31 DIAGNOSIS — R197 Diarrhea, unspecified: Secondary | ICD-10-CM | POA: Diagnosis not present

## 2021-03-31 LAB — COMPREHENSIVE METABOLIC PANEL
ALT: 46 U/L — ABNORMAL HIGH (ref 0–44)
AST: 45 U/L — ABNORMAL HIGH (ref 15–41)
Albumin: 3.7 g/dL (ref 3.5–5.0)
Alkaline Phosphatase: 111 U/L (ref 38–126)
Anion gap: 9 (ref 5–15)
BUN: 12 mg/dL (ref 6–20)
CO2: 23 mmol/L (ref 22–32)
Calcium: 9.3 mg/dL (ref 8.9–10.3)
Chloride: 99 mmol/L (ref 98–111)
Creatinine, Ser: 0.83 mg/dL (ref 0.61–1.24)
GFR, Estimated: >60 mL/min (ref >60)
Glucose, Bld: 398 mg/dL — ABNORMAL HIGH (ref 70–99)
Potassium: 3.9 mmol/L (ref 3.5–5.1)
Sodium: 131 mmol/L — ABNORMAL LOW (ref 135–145)
Total Bilirubin: 1 mg/dL (ref 0.3–1.2)
Total Protein: 7.7 g/dL (ref 6.5–8.1)

## 2021-03-31 LAB — URINALYSIS, COMPLETE (UACMP) WITH MICROSCOPIC
Bacteria, UA: NONE SEEN
Bilirubin Urine: NEGATIVE
Glucose, UA: >=500 mg/dL — AB
Hgb urine dipstick: NEGATIVE
Ketones, ur: NEGATIVE mg/dL
Leukocytes,Ua: NEGATIVE
Nitrite: NEGATIVE
Protein, ur: NEGATIVE mg/dL
Specific Gravity, Urine: 1.031 — ABNORMAL HIGH (ref 1.005–1.030)
Squamous Epithelial / LPF: NONE SEEN (ref 0–5)
pH: 6 (ref 5.0–8.0)

## 2021-03-31 LAB — CBC
HCT: 41.4 % (ref 39.0–52.0)
Hemoglobin: 14.5 g/dL (ref 13.0–17.0)
MCH: 31.1 pg (ref 26.0–34.0)
MCHC: 35 g/dL (ref 30.0–36.0)
MCV: 88.8 fL (ref 80.0–100.0)
Platelets: 125 10*3/uL — ABNORMAL LOW (ref 150–400)
RBC: 4.66 MIL/uL (ref 4.22–5.81)
RDW: 12.8 % (ref 11.5–15.5)
WBC: 7.4 10*3/uL (ref 4.0–10.5)
nRBC: 0 % (ref 0.0–0.2)

## 2021-03-31 LAB — LIPASE, BLOOD: Lipase: 41 U/L (ref 11–51)

## 2021-03-31 MED ORDER — FENTANYL CITRATE (PF) 100 MCG/2ML IJ SOLN
50.0000 ug | Freq: Once | INTRAMUSCULAR | Status: AC
  Administered 2021-03-31: 50 ug via INTRAVENOUS
  Filled 2021-03-31: qty 2

## 2021-03-31 MED ORDER — ONDANSETRON HCL 4 MG/2ML IJ SOLN
4.0000 mg | Freq: Once | INTRAMUSCULAR | Status: AC
Start: 2021-03-31 — End: 2021-03-31
  Administered 2021-03-31: 4 mg via INTRAVENOUS
  Filled 2021-03-31: qty 2

## 2021-03-31 MED ORDER — IOHEXOL 300 MG/ML  SOLN
100.0000 mL | Freq: Once | INTRAMUSCULAR | Status: AC | PRN
  Administered 2021-03-31: 100 mL via INTRAVENOUS

## 2021-03-31 MED ORDER — LACTATED RINGERS IV BOLUS
1000.0000 mL | Freq: Once | INTRAVENOUS | Status: AC
  Administered 2021-03-31: 1000 mL via INTRAVENOUS

## 2021-03-31 NOTE — Discharge Instructions (Addendum)

## 2021-03-31 NOTE — ED Triage Notes (Signed)
LLQ abd pain with intermittent radiation to L flank. Hx of diverticulitis and states pain feels the same. Pt also reports recent treatment for diverticulitis flare and compliance with medications. Reports diarrhea without blood in stool.

## 2021-03-31 NOTE — ED Provider Notes (Signed)
[facility] Emergency Department Provider Note  ____________________________________________  Time seen: Approximately 6:38 AM  I have reviewed the triage vital signs and the nursing notes.   HISTORY  Chief Complaint Abdominal Pain   HPI Patrick Ramos is a 60 y.o. male with a history of diabetes, hypertension, diverticulitis who presents for evaluation of left lower quadrant abdominal pain.   Patient had similar symptoms 3 weeks ago.  Took a course of antibiotics with resolution of his symptoms.  Started having pain 2 days ago.  The pain is dull and constant located in the left lower quadrant radiating to the left flank.  He took a pain pill that was prescribed to him during the last episode and reports that the pain now has subsided.  He denies nausea, vomiting, constipation.  Has had some diarrhea with no blood or melena.  No fever or chills.  Past Medical History:  Diagnosis Date  . Diabetes mellitus without complication ([place])   . Hypertension   . Tachycardia     There are no problems to display for this patient.   History reviewed. No pertinent surgical history.  Prior to Admission medications   Medication Sig Start Date End Date Taking? Authorizing Provider  ciprofloxacin (CIPRO) 500 MG tablet Take 1 tablet (500 mg total) by mouth 2 (two) times daily. 04/30/19   Carrie Mew, MD  glipiZIDE (GLUCOTROL) 10 MG tablet Take 1 tablet (10 mg total) by mouth 2 (two) times daily. 05/07/18 05/07/19  Harvest Dark, MD  metroNIDAZOLE (FLAGYL) 500 MG tablet Take 1 tablet (500 mg total) by mouth 2 (two) times daily. 04/30/19   Carrie Mew, MD  penicillin v potassium (VEETID) 500 MG tablet Take 1 tablet (500 mg total) by mouth 4 (four) times daily. 05/07/18   Harvest Dark, MD    Allergies Oxycodone-acetaminophen  History reviewed. No pertinent family history.  Social History Social History   Tobacco Use  . Smoking status: Never Smoker   . Smokeless tobacco: Never Used  Vaping Use  . Vaping Use: Never used  Substance Use Topics  . Alcohol use: Not Currently  . Drug use: Never    Review of Systems  Constitutional: Negative for fever. Eyes: Negative for visual changes. ENT: Negative for sore throat. Neck: No neck pain  Cardiovascular: Negative for chest pain. Respiratory: Negative for shortness of breath. Gastrointestinal: + abdominal pain and diarrhea. No vomiting  Genitourinary: Negative for dysuria. Musculoskeletal: Negative for back pain. Skin: Negative for rash. Neurological: Negative for headaches, weakness or numbness. Psych: No SI or HI  ____________________________________________   PHYSICAL EXAM:  VITAL SIGNS: ED Triage Vitals  Enc Vitals Group     BP 03/31/21 0103 (!) 150/81     Pulse Rate 03/31/21 0103 78     Resp 03/31/21 0103 18     Temp 03/31/21 0103 98.8 F (37.1 C)     Temp Source 03/31/21 0103 Oral     SpO2 03/31/21 0102 95 %     Weight 03/31/21 0103 155 lb (70.3 kg)     Height 03/31/21 0103 _0  (1.676 m)     Head Circumference --      Peak Flow --      Pain Score 03/31/21 0103 4     Pain Loc --      Pain Edu? --      Excl. in [place]? --     Constitutional: Alert and oriented. Well appearing and in no apparent distress. HEENT:  Head: Normocephalic and atraumatic.         Eyes: Conjunctivae are normal. Sclera is non-icteric.       Mouth/Throat: Mucous membranes are moist.       Neck: Supple with no signs of meningismus. Cardiovascular: Regular rate and rhythm. No murmurs, gallops, or rubs. 2+ symmetrical distal pulses are present in all extremities. No JVD. Respiratory: Normal respiratory effort. Lungs are clear to auscultation bilaterally.  Gastrointestinal: Soft, mild left lower quadrant tenderness, and non distended with positive bowel sounds. No rebound or guarding. Genitourinary:Bilateral testicles are descended with no tenderness to palpation, bilateral positive  cremasteric reflexes are present, no swelling or erythema of the scrotum. No evidence of inguinal hernia. Musculoskeletal:  No edema, cyanosis, or erythema of extremities. Neurologic: Normal speech and language. Face is symmetric. Moving all extremities. No gross focal neurologic deficits are appreciated. Skin: Skin is warm, dry and intact. No rash noted. Psychiatric: Mood and affect are normal. Speech and behavior are normal.  ____________________________________________   LABS (all labs ordered are listed, but only abnormal results are displayed)  Labs Reviewed  COMPREHENSIVE METABOLIC PANEL - Abnormal; Notable for the following components:      Result Value   Sodium 131 (*)    Glucose, Bld 398 (*)    AST 45 (*)    ALT 46 (*)    All other components within normal limits  CBC - Abnormal; Notable for the following components:   Platelets 125 (*)    All other components within normal limits  URINALYSIS, COMPLETE (UACMP) WITH MICROSCOPIC - Abnormal; Notable for the following components:   Color, Urine YELLOW (*)    APPearance CLEAR (*)    Specific Gravity, Urine 1.031 (*)    Glucose, UA >=500 (*)    All other components within normal limits  LIPASE, BLOOD   ____________________________________________  EKG  none  ____________________________________________  RADIOLOGY  I have personally reviewed the images performed during this visit and I agree with the Radiologist's read.   Interpretation by Radiologist:  CT ABDOMEN PELVIS W CONTRAST  Result Date: 03/31/2021 CLINICAL DATA:  60 year old male with history of left lower quadrant abdominal pain with intermittent radiation to the left flank. EXAM: CT ABDOMEN AND PELVIS WITH CONTRAST TECHNIQUE: Multidetector CT imaging of the abdomen and pelvis was performed using the standard protocol following bolus administration of intravenous contrast. CONTRAST:  176m OMNIPAQUE IOHEXOL 300 MG/ML  SOLN COMPARISON:  CT the abdomen and pelvis  04/30/2019. FINDINGS: Lower chest: Atherosclerotic calcification in the left main and left anterior descending coronary artery. Hepatobiliary: Liver has a shrunken appearance and nodular contour, indicative of underlying cirrhosis. No suspicious cystic or solid hepatic lesions are confidently identified. No intra or extrahepatic biliary ductal dilatation. Gallbladder is normal in appearance. Pancreas: No pancreatic mass. No pancreatic ductal dilatation. No pancreatic or peripancreatic fluid collections or inflammatory changes. Spleen: Unremarkable. Adrenals/Urinary Tract: Bilateral kidneys and adrenal glands are normal in appearance. No hydroureteronephrosis. Urinary bladder is normal in appearance. Stomach/Bowel: The appearance of the stomach is normal. There is no pathologic dilatation of small bowel or colon. Normal appendix. Vascular/Lymphatic: Aortic atherosclerosis, without evidence of aneurysm or dissection in the abdominal or pelvic vasculature. No lymphadenopathy noted in the abdomen or pelvis. Reproductive: Prostate gland and seminal vesicles are unremarkable in appearance. Other: No significant volume of ascites.  No pneumoperitoneum. Musculoskeletal: There are no aggressive appearing lytic or blastic lesions noted in the visualized portions of the skeleton. IMPRESSION: 1. No acute findings are noted in  the abdomen or pelvis to account for the patient's symptoms. 2. Hepatic cirrhosis. 3. Aortic atherosclerosis, in addition to left main and left anterior descending coronary artery disease. Please note that although the presence of coronary artery calcium documents the presence of coronary artery disease, the severity of this disease and any potential stenosis cannot be assessed on this non-gated CT examination. Assessment for potential risk factor modification, dietary therapy or pharmacologic therapy may be warranted, if clinically indicated. Electronically Signed   By: Vinnie Langton M.D.   On:  03/31/2021 06:45      ____________________________________________   PROCEDURES  Procedure(s) performed: None Procedures Critical Care performed:  None ____________________________________________   INITIAL IMPRESSION / ASSESSMENT AND PLAN / ED COURSE  60 y.o. male with a history of diabetes, hypertension, diverticulitis who presents for evaluation of left lower quadrant abdominal pain x 2 days associated with diarrhea.  After taking a pain pill at home his pain has resolved at this time but he does have mild tenderness in the left lower quadrant no rebound or guarding.  Has normal vital signs.  DDX diverticulitis versus colitis versus pyelonephritis versus kidney stones    labs showing no leukocytosis or anemia, stable mild thrombocytopenia.  UA with no signs of urinary tract infection or blood.  CMP showing hyperglycemia with glucose of 398 with no signs of DKA.  Mildly elevated LFTs but normal alk phos, T bili, lipase.  Patient is not tender on the right upper quadrant. CT generalized by me with no explanation for patient's pain, confirmed by radiology   Patient received IVF bolus, IV fentanyl and zofran for symptoms.  Patient remains without any pain.  Will discharge home with supportive care follow-up with PCP.  Recommended return to the emergency room for new or worsening abdominal pain or fever.       _____________________________________________ Please note:  Patient was evaluated in Emergency Department today for the symptoms described in the history of present illness. Patient was evaluated in the context of the global COVID-19 pandemic, which necessitated consideration that the patient might be at risk for infection with the SARS-CoV-2 virus that causes COVID-19. Institutional protocols and algorithms that pertain to the evaluation of patients at risk for COVID-19 are in a state of rapid change based on information released by regulatory bodies including the CDC and  federal and state organizations. These policies and algorithms were followed during the patient's care in the ED.  Some ED evaluations and interventions may be delayed as a result of limited staffing during the pandemic.   [place] Controlled Substance Database was reviewed by me. ____________________________________________   FINAL CLINICAL IMPRESSION(S) / ED DIAGNOSES   Final diagnoses:  Left lower quadrant abdominal pain      NEW MEDICATIONS STARTED DURING THIS VISIT:  ED Discharge Orders    None       Note:  This document was prepared using Dragon voice recognition software and may include unintentional dictation errors.    Alfred Levins, [place], MD 03/31/21 [phone]

## 2021-11-25 IMAGING — CT CT ABD-PELV W/ CM
2 of 5 series · 15 of 46 positions shown, 17 images · IV contrast (APPLIED)
Comparison: CT the abdomen and pelvis 04/30/2019.

CLINICAL DATA: 59-year-old male with history of left lower quadrant
abdominal pain with intermittent radiation to the left flank.

EXAM:
CT ABDOMEN AND PELVIS WITH CONTRAST
TECHNIQUE: Multidetector CT imaging of the abdomen and pelvis was performed
using the standard protocol following bolus administration of
intravenous contrast.
CONTRAST:  100mL OMNIPAQUE IOHEXOL 300 MG/ML  SOLN

[Series 2: routine abd/pel with · axial · 0.84mm/px · z∈[-979,-509]mm · 12 of 106 slices shown, 14 images]
[im 6/106  soft-tissue]
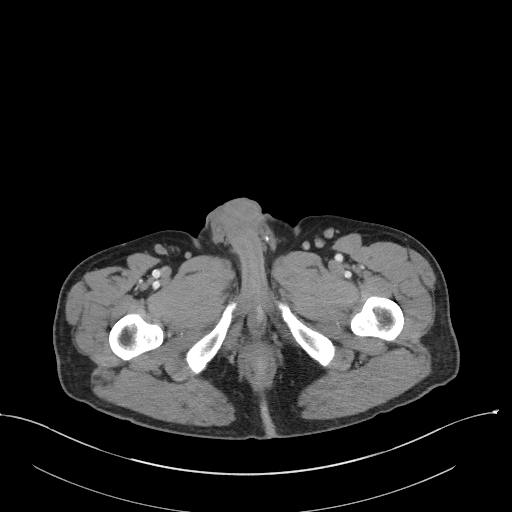
[im 6/106  bone]
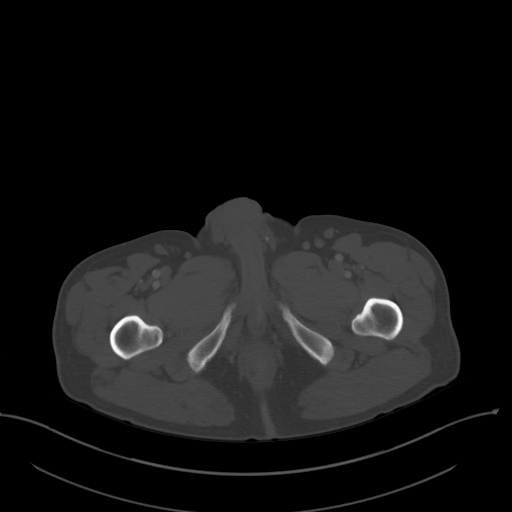
[im 17/106  soft-tissue]
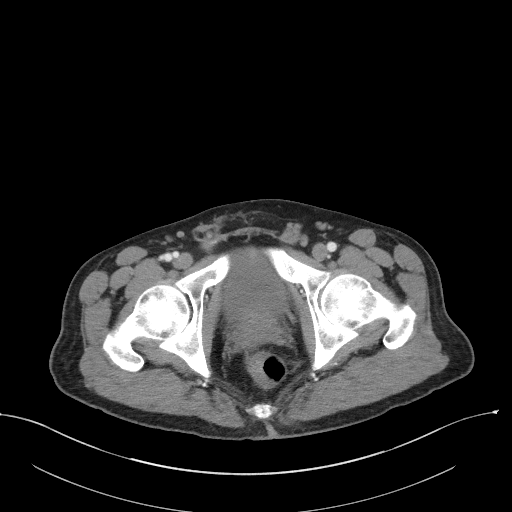
[im 23/106  soft-tissue]
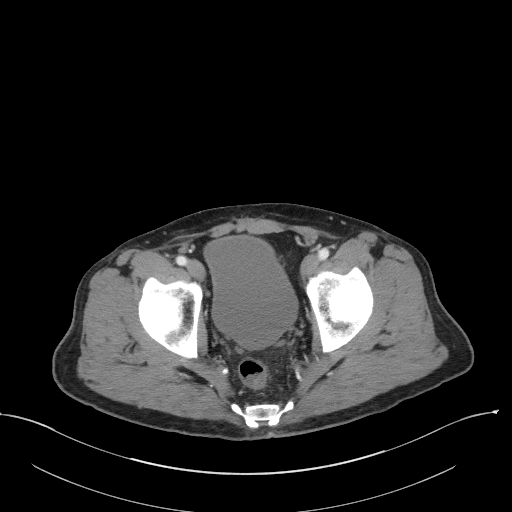
[im 34/106  soft-tissue]
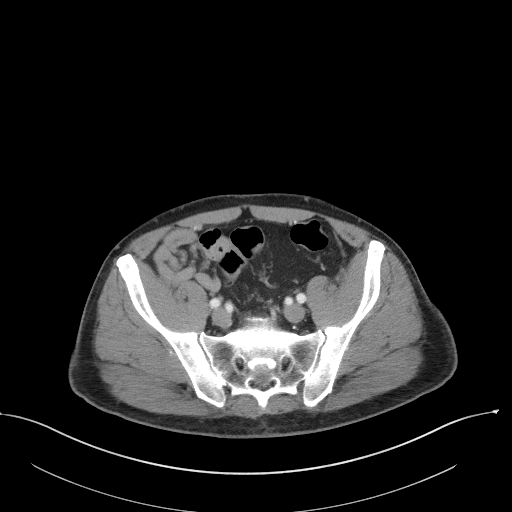
[im 39/106  soft-tissue]
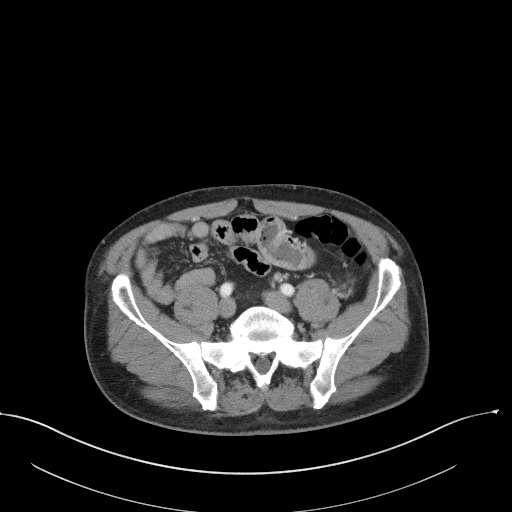
[im 50/106  soft-tissue]
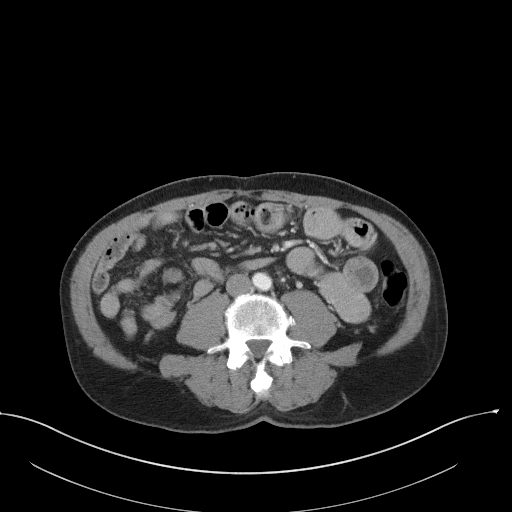
[im 56/106  soft-tissue]
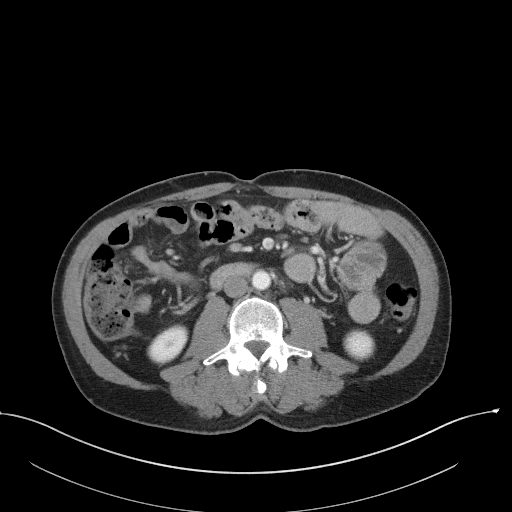
[im 67/106  soft-tissue]
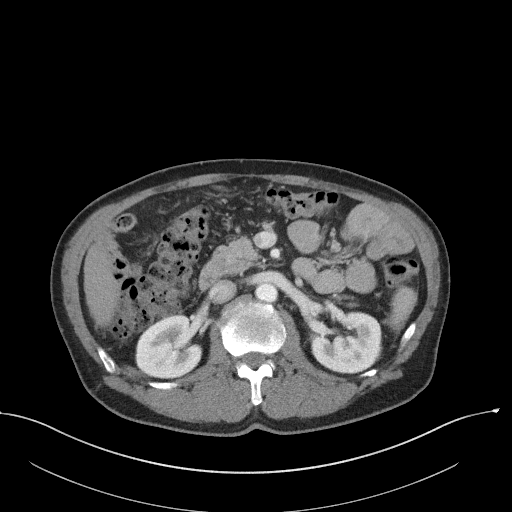
[im 72/106  soft-tissue]
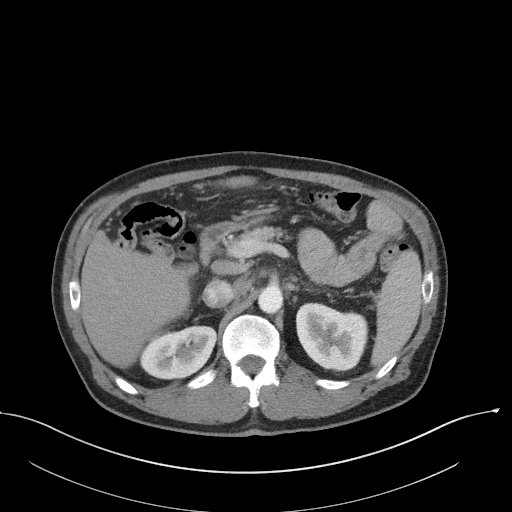
[im 72/106  bone]
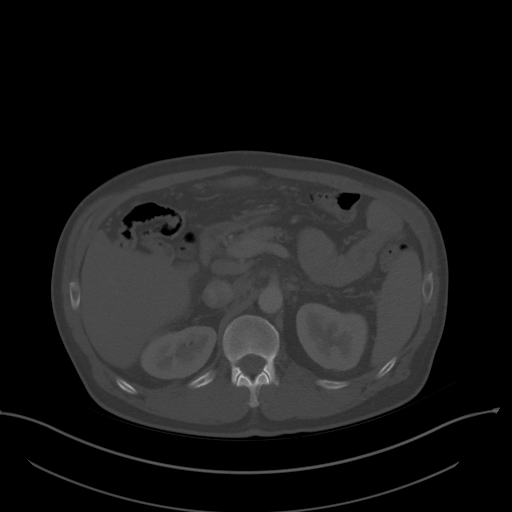
[im 83/106  soft-tissue]
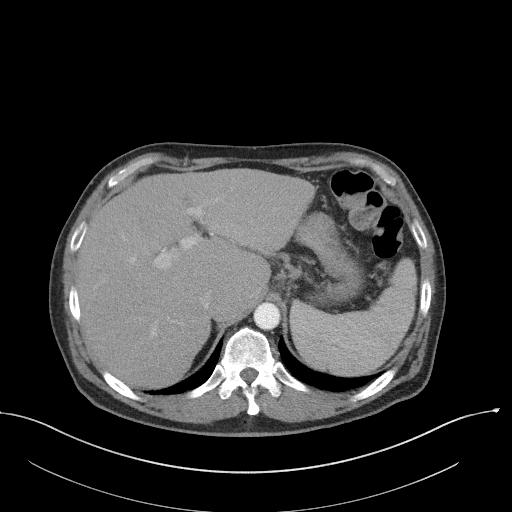
[im 89/106  soft-tissue]
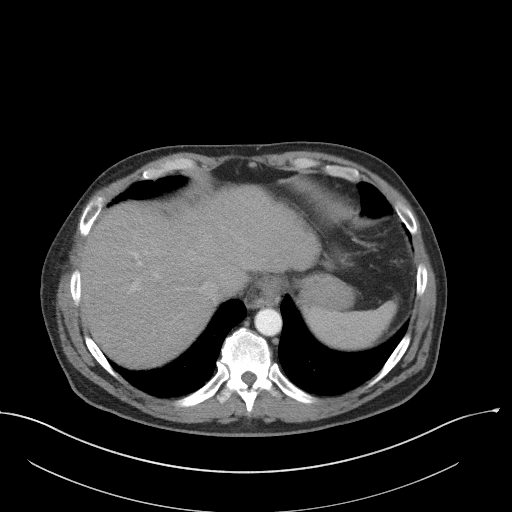
[im 100/106  soft-tissue]
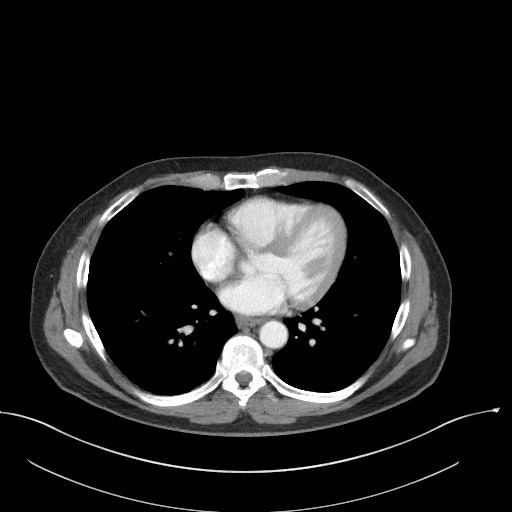

[Series 5: coronal st · coronal · 0.74mm/px · 3 of 86 slices shown]
[im 29/86  soft-tissue]
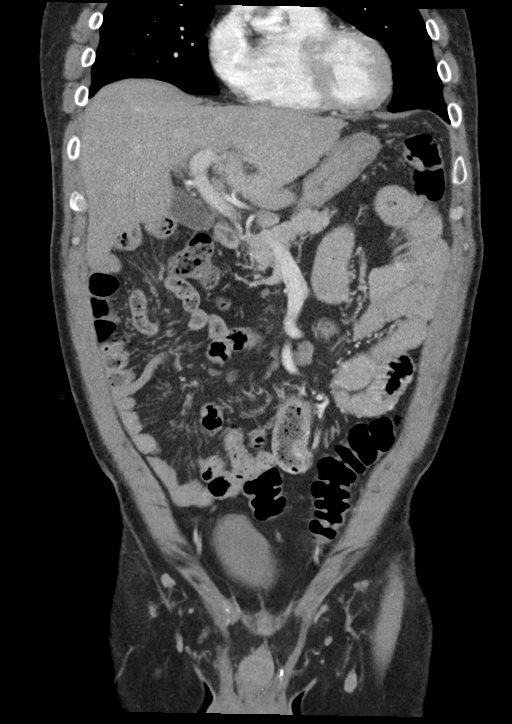
[im 38/86  soft-tissue]
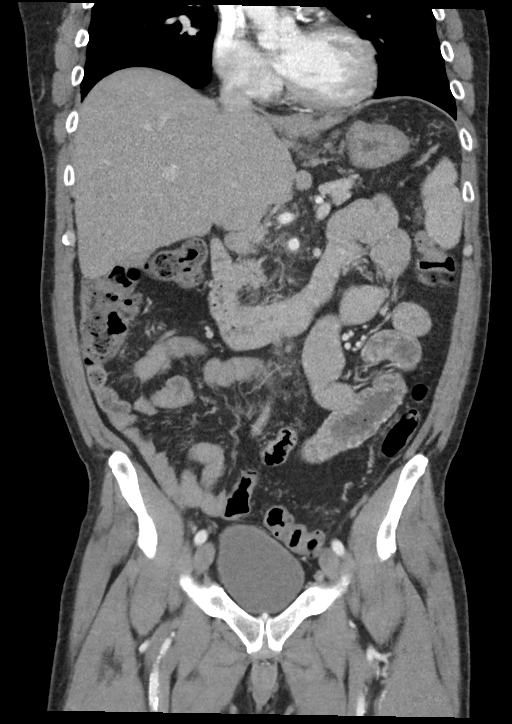
[im 48/86  soft-tissue]
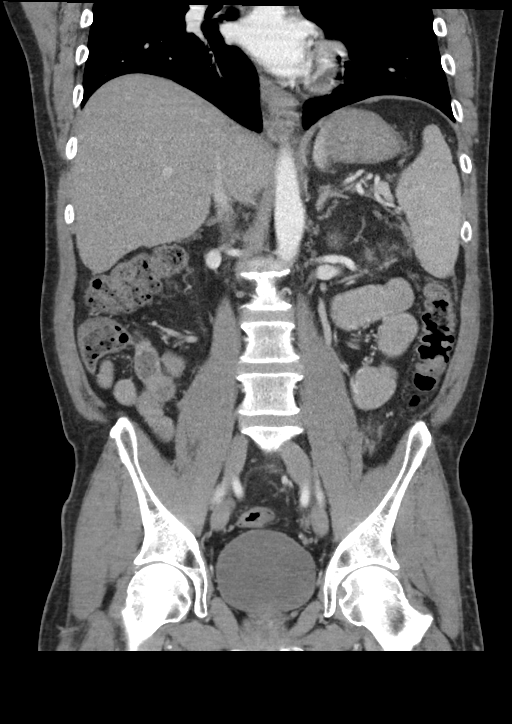

[15 of 46 positions shown; findings below may reference images not displayed]

FINDINGS: Lower chest: Atherosclerotic calcification in the left main and left
anterior descending coronary artery.

Hepatobiliary: Liver has a shrunken appearance and nodular contour,
indicative of underlying cirrhosis. No suspicious cystic or solid
hepatic lesions are confidently identified. No intra or extrahepatic
biliary ductal dilatation. Gallbladder is normal in appearance.

Pancreas: No pancreatic mass. No pancreatic ductal dilatation. No
pancreatic or peripancreatic fluid collections or inflammatory
changes.

Spleen: Unremarkable.

Adrenals/Urinary Tract: Bilateral kidneys and adrenal glands are
normal in appearance. No hydroureteronephrosis. Urinary bladder is
normal in appearance.

Stomach/Bowel: The appearance of the stomach is normal. There is no
pathologic dilatation of small bowel or colon. Normal appendix.

Vascular/Lymphatic: Aortic atherosclerosis, without evidence of
aneurysm or dissection in the abdominal or pelvic vasculature. No
lymphadenopathy noted in the abdomen or pelvis.

Reproductive: Prostate gland and seminal vesicles are unremarkable
in appearance.

Other: No significant volume of ascites.  No pneumoperitoneum.

Musculoskeletal: There are no aggressive appearing lytic or blastic
lesions noted in the visualized portions of the skeleton.
IMPRESSION: 1. No acute findings are noted in the abdomen or pelvis to account
for the patient's symptoms.
2. Hepatic cirrhosis.
3. Aortic atherosclerosis, in addition to left main and left
anterior descending coronary artery disease. Please note that
although the presence of coronary artery calcium documents the
presence of coronary artery disease, the severity of this disease
and any potential stenosis cannot be assessed on this non-gated CT
examination. Assessment for potential risk factor modification,
dietary therapy or pharmacologic therapy may be warranted, if
clinically indicated.

## 2022-02-11 ENCOUNTER — Other Ambulatory Visit: Payer: Self-pay | Admitting: Internal Medicine

## 2022-02-11 DIAGNOSIS — K7469 Other cirrhosis of liver: Secondary | ICD-10-CM

## 2022-02-15 ENCOUNTER — Ambulatory Visit
Admission: RE | Admit: 2022-02-15 | Discharge: 2022-02-15 | Disposition: A | Discharge status: Home / Self Care | Payer: 59 | Source: Ambulatory Visit | Attending: Internal Medicine | Admitting: Internal Medicine

## 2022-02-15 ENCOUNTER — Other Ambulatory Visit: Payer: Self-pay

## 2022-02-15 DIAGNOSIS — K7469 Other cirrhosis of liver: Secondary | ICD-10-CM | POA: Insufficient documentation

## 2022-06-28 DIAGNOSIS — M109 Gout, unspecified: Secondary | ICD-10-CM | POA: Insufficient documentation

## 2022-06-28 DIAGNOSIS — M1990 Unspecified osteoarthritis, unspecified site: Secondary | ICD-10-CM | POA: Insufficient documentation

## 2022-10-12 IMAGING — US US ABDOMEN LIMITED
1 series · 14 of 25 positions shown · non-contrast
Comparison: None.

CLINICAL DATA: Cirrhosis of the liver.

EXAM:
ULTRASOUND ABDOMEN LIMITED RIGHT UPPER QUADRANT

[Series 1: general · 14 of 80 slices shown]
[im 1/80]
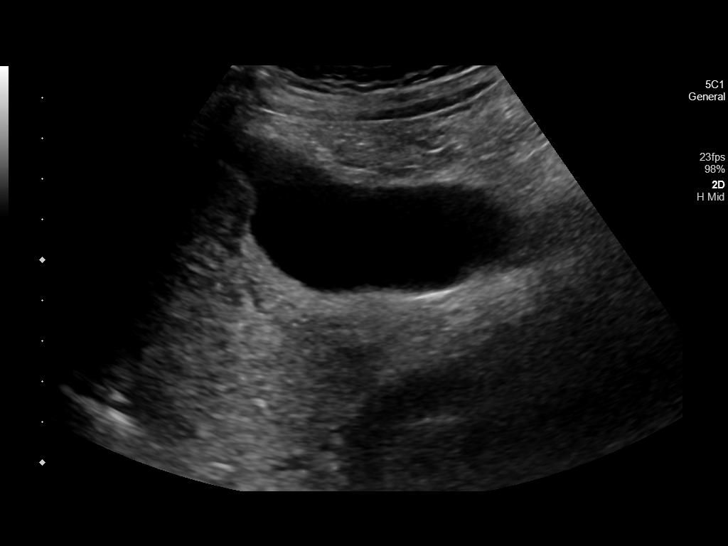
[im 7/80]
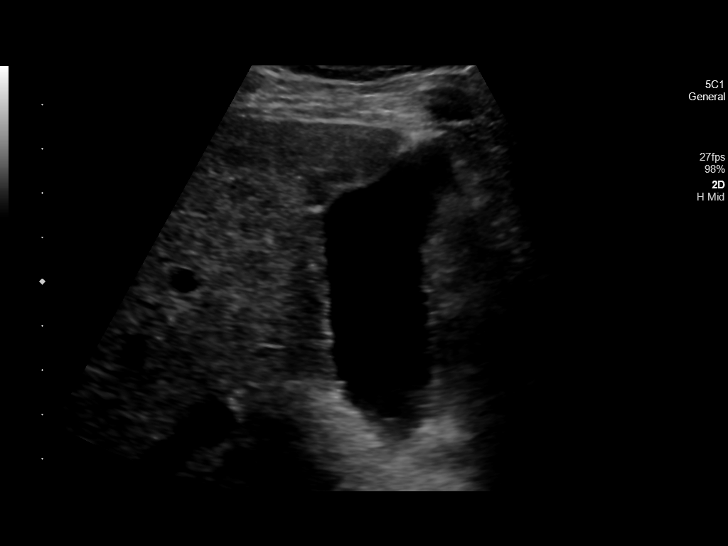
[im 14/80]
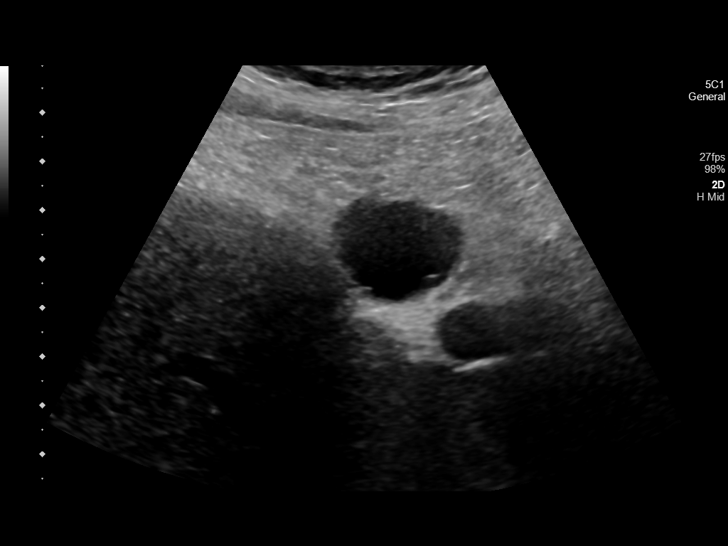
[im 20/80]
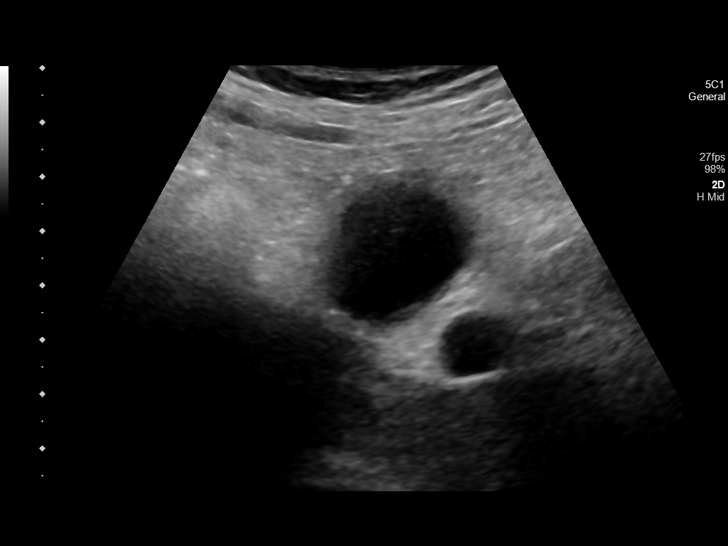
[im 27/80]
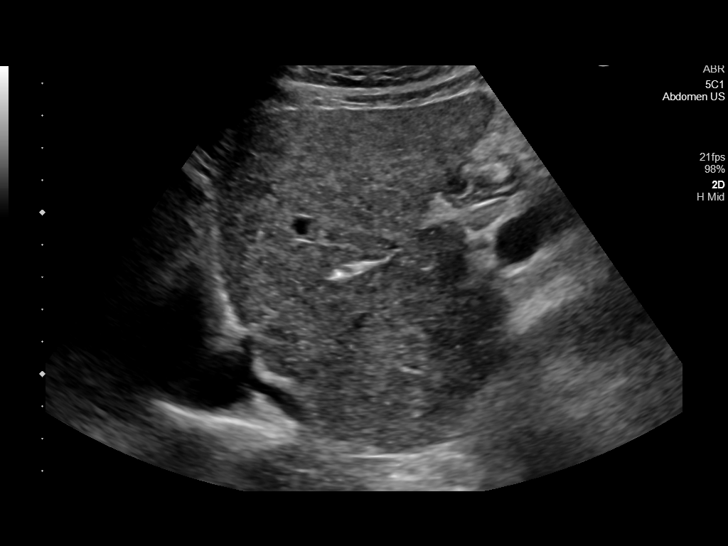
[im 30/80]
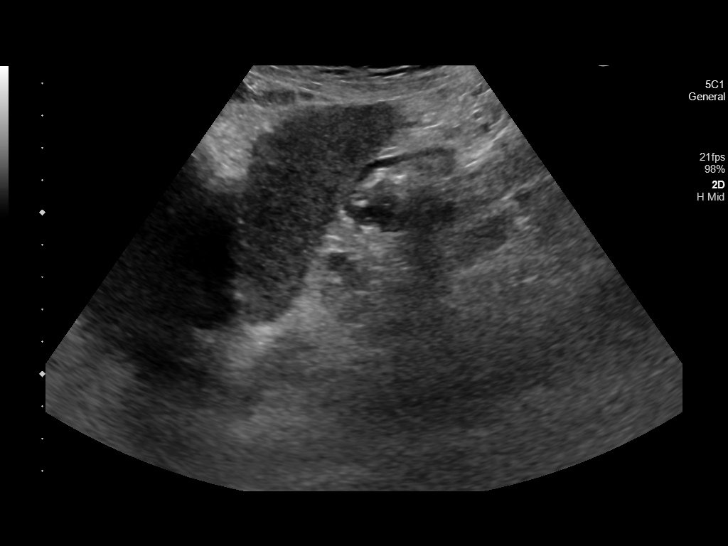
[im 37/80]
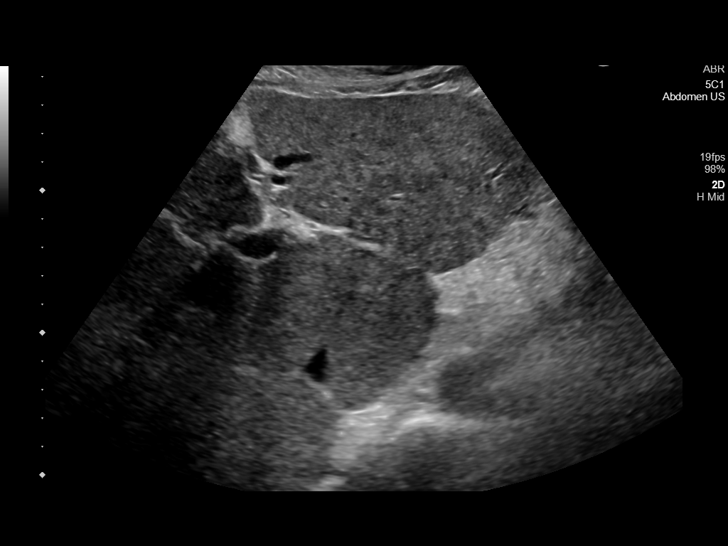
[im 43/80]
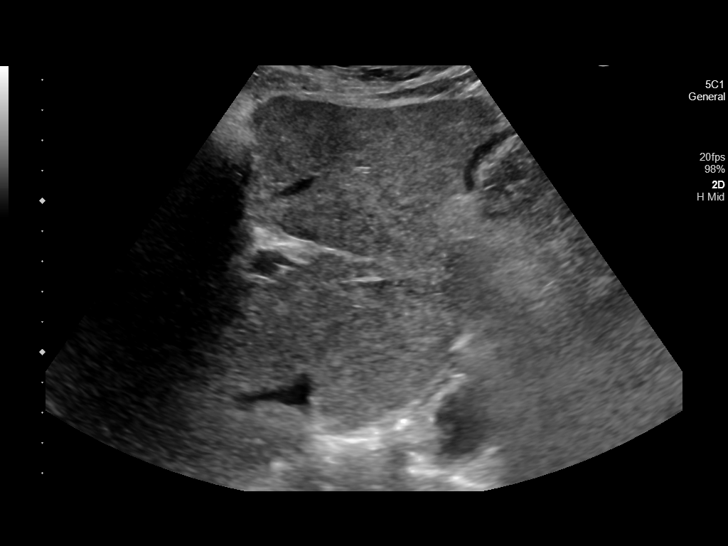
[im 50/80]
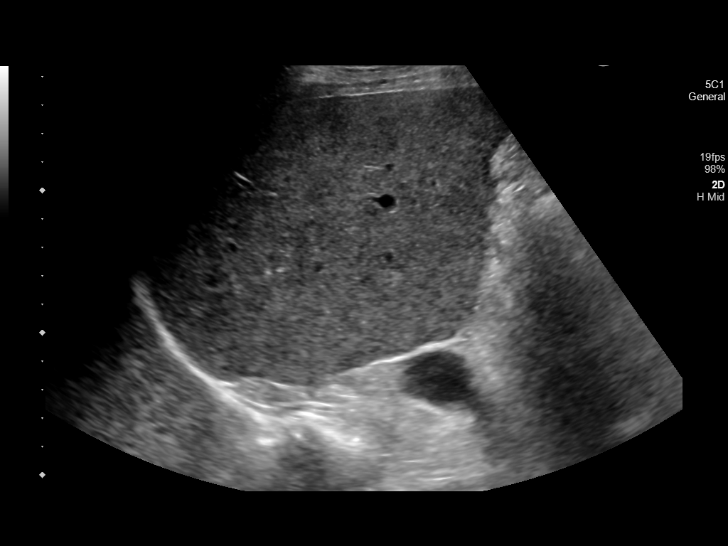
[im 53/80]
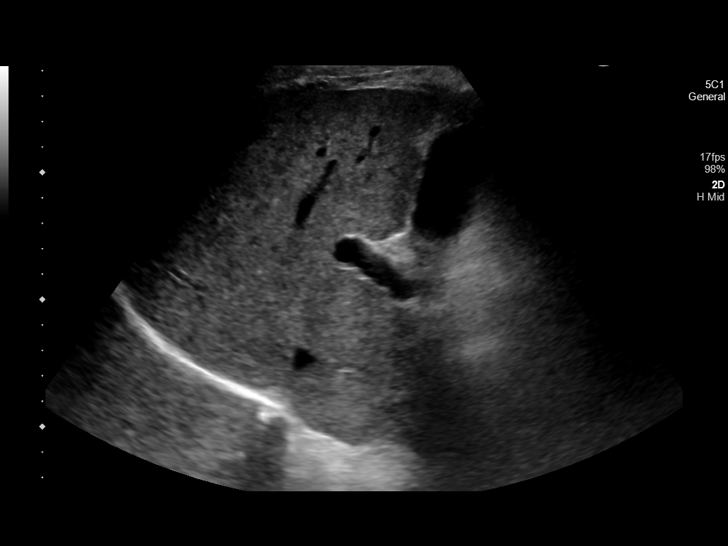
[im 60/80]
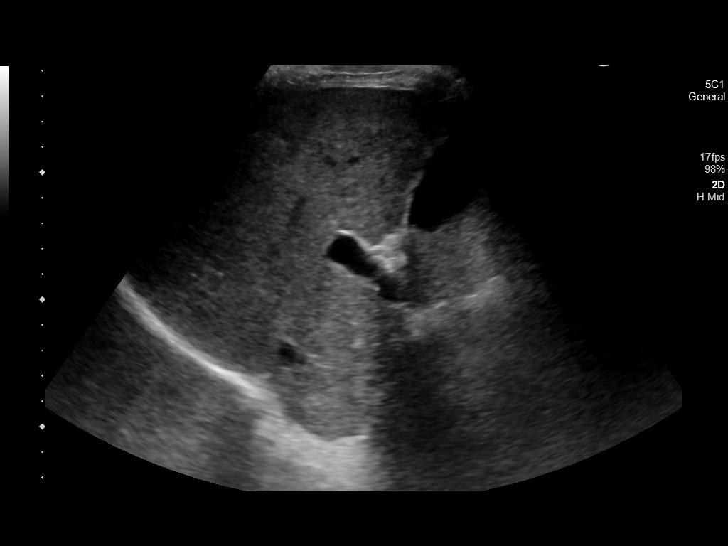
[im 66/80]
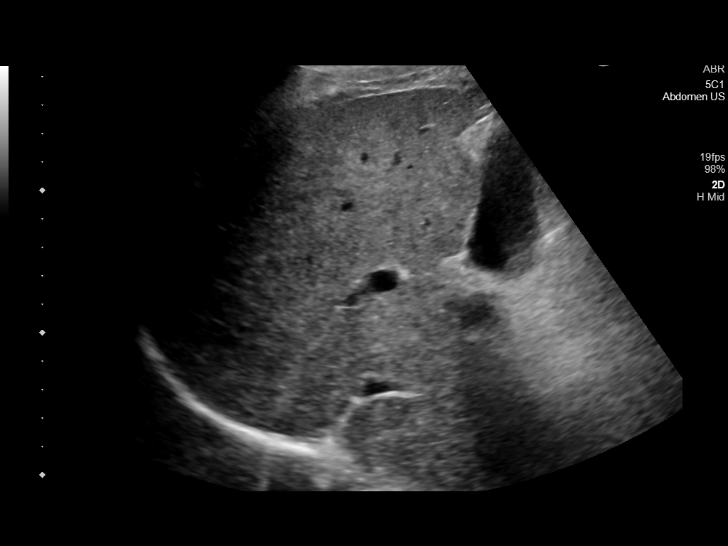
[im 73/80]
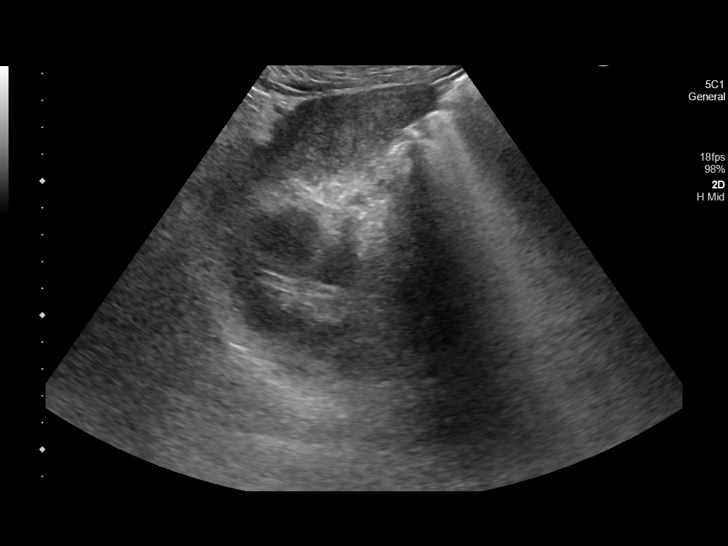
[im 80/80]
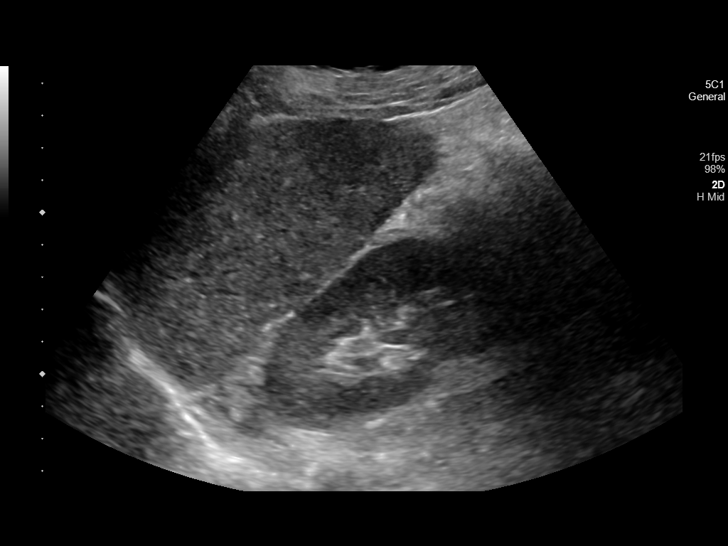

[14 of 25 positions shown; findings below may reference images not displayed]

FINDINGS: Gallbladder:

No gallstones or wall thickening visualized (1.4 mm). No sonographic
Murphy sign noted by sonographer.

Common bile duct:

Diameter: 2.5 mm

Liver:

No focal lesion identified. The liver parenchyma is coarse in
echotexture and diffusely increased in echogenicity. Portal vein is
patent on color Doppler imaging with normal direction of blood flow
towards the liver.

Other: None.
IMPRESSION: Hepatic steatosis and hepatic cirrhosis without focal liver lesions.

## 2022-12-03 DIAGNOSIS — I2510 Atherosclerotic heart disease of native coronary artery without angina pectoris: Secondary | ICD-10-CM | POA: Insufficient documentation

## 2023-11-11 DIAGNOSIS — H538 Other visual disturbances: Secondary | ICD-10-CM | POA: Diagnosis not present

## 2023-11-11 DIAGNOSIS — H2511 Age-related nuclear cataract, right eye: Secondary | ICD-10-CM | POA: Diagnosis not present

## 2023-11-11 DIAGNOSIS — H2512 Age-related nuclear cataract, left eye: Secondary | ICD-10-CM | POA: Diagnosis not present

## 2023-11-11 DIAGNOSIS — E113293 Type 2 diabetes mellitus with mild nonproliferative diabetic retinopathy without macular edema, bilateral: Secondary | ICD-10-CM | POA: Diagnosis not present

## 2023-11-11 DIAGNOSIS — H04123 Dry eye syndrome of bilateral lacrimal glands: Secondary | ICD-10-CM | POA: Diagnosis not present

## 2023-11-24 ENCOUNTER — Encounter: Payer: Self-pay | Admitting: Ophthalmology

## 2023-11-25 DIAGNOSIS — H2512 Age-related nuclear cataract, left eye: Secondary | ICD-10-CM | POA: Diagnosis not present

## 2023-12-01 NOTE — Discharge Instructions (Signed)

## 2023-12-03 ENCOUNTER — Other Ambulatory Visit: Payer: Self-pay

## 2023-12-03 ENCOUNTER — Ambulatory Visit: Payer: 59 | Admitting: Anesthesiology

## 2023-12-03 ENCOUNTER — Encounter: Payer: Self-pay | Admitting: Ophthalmology

## 2023-12-03 ENCOUNTER — Encounter
Admission: RE | Disposition: A | Discharge status: Home / Self Care | Payer: Self-pay | Source: Home / Self Care | Attending: Ophthalmology

## 2023-12-03 ENCOUNTER — Ambulatory Visit
Admission: RE | Admit: 2023-12-03 | Discharge: 2023-12-03 | Disposition: A | Discharge status: Home / Self Care | Payer: 59 | Attending: Ophthalmology | Admitting: Ophthalmology

## 2023-12-03 DIAGNOSIS — Z794 Long term (current) use of insulin: Secondary | ICD-10-CM | POA: Diagnosis not present

## 2023-12-03 DIAGNOSIS — Z7984 Long term (current) use of oral hypoglycemic drugs: Secondary | ICD-10-CM | POA: Diagnosis not present

## 2023-12-03 DIAGNOSIS — I10 Essential (primary) hypertension: Secondary | ICD-10-CM | POA: Insufficient documentation

## 2023-12-03 DIAGNOSIS — E1136 Type 2 diabetes mellitus with diabetic cataract: Secondary | ICD-10-CM | POA: Insufficient documentation

## 2023-12-03 DIAGNOSIS — H2512 Age-related nuclear cataract, left eye: Secondary | ICD-10-CM | POA: Diagnosis not present

## 2023-12-03 HISTORY — PX: CATARACT EXTRACTION W/PHACO: SHX586

## 2023-12-03 LAB — GLUCOSE, CAPILLARY: Glucose-Capillary: 132 mg/dL — ABNORMAL HIGH (ref 70–99)

## 2023-12-03 SURGERY — PHACOEMULSIFICATION, CATARACT, WITH IOL INSERTION
Anesthesia: Monitor Anesthesia Care | Laterality: Left

## 2023-12-03 MED ORDER — BRIMONIDINE TARTRATE-TIMOLOL 0.2-0.5 % OP SOLN
OPHTHALMIC | Status: DC | PRN
  Administered 2023-12-03: 1 [drp] via OPHTHALMIC

## 2023-12-03 MED ORDER — SIGHTPATH DOSE#1 BSS IO SOLN
INTRAOCULAR | Status: DC | PRN
  Administered 2023-12-03: 51 mL via OPHTHALMIC

## 2023-12-03 MED ORDER — TETRACAINE HCL 0.5 % OP SOLN
1.0000 [drp] | OPHTHALMIC | Status: DC | PRN
Start: 2023-12-03 — End: 2023-12-03
  Administered 2023-12-03 (×2): 1 [drp] via OPHTHALMIC

## 2023-12-03 MED ORDER — SIGHTPATH DOSE#1 NA HYALUR & NA CHOND-NA HYALUR IO KIT
PACK | INTRAOCULAR | Status: DC | PRN
  Administered 2023-12-03: 1 via OPHTHALMIC

## 2023-12-03 MED ORDER — SIGHTPATH DOSE#1 BSS IO SOLN
INTRAOCULAR | Status: DC | PRN
  Administered 2023-12-03: 2 mL

## 2023-12-03 MED ORDER — MIDAZOLAM HCL 2 MG/2ML IJ SOLN
INTRAMUSCULAR | Status: DC | PRN
  Administered 2023-12-03: 2 mg via INTRAVENOUS

## 2023-12-03 MED ORDER — CEFUROXIME OPHTHALMIC INJECTION 1 MG/0.1 ML
INJECTION | OPHTHALMIC | Status: DC | PRN
  Administered 2023-12-03: 1 mg via INTRACAMERAL

## 2023-12-03 MED ORDER — MIDAZOLAM HCL 2 MG/2ML IJ SOLN
INTRAMUSCULAR | Status: AC
  Filled 2023-12-03: qty 2

## 2023-12-03 MED ORDER — SIGHTPATH DOSE#1 BSS IO SOLN
INTRAOCULAR | Status: DC | PRN
  Administered 2023-12-03: 15 mL via INTRAOCULAR

## 2023-12-03 MED ORDER — FENTANYL CITRATE (PF) 100 MCG/2ML IJ SOLN
INTRAMUSCULAR | Status: AC
  Filled 2023-12-03: qty 2

## 2023-12-03 MED ORDER — FENTANYL CITRATE (PF) 100 MCG/2ML IJ SOLN
INTRAMUSCULAR | Status: DC | PRN
  Administered 2023-12-03: 100 ug via INTRAVENOUS

## 2023-12-03 MED ORDER — ARMC OPHTHALMIC DILATING DROPS
1.0000 | OPHTHALMIC | Status: DC | PRN
  Administered 2023-12-03 (×3): 1 via OPHTHALMIC

## 2023-12-03 MED ORDER — TETRACAINE HCL 0.5 % OP SOLN
OPHTHALMIC | Status: AC
  Filled 2023-12-03: qty 4

## 2023-12-03 SURGICAL SUPPLY — 17 items
CANNULA ANT/CHMB 27G (MISCELLANEOUS) IMPLANT
CANNULA ANT/CHMB 27GA (MISCELLANEOUS)
CATARACT SUITE SIGHTPATH (MISCELLANEOUS) ×1
FEE CATARACT SUITE SIGHTPATH (MISCELLANEOUS) ×1 IMPLANT
GLOVE SRG 8 PF TXTR STRL LF DI (GLOVE) ×1 IMPLANT
GLOVE SURG ENC TEXT LTX SZ7.5 (GLOVE) ×1 IMPLANT
GLOVE SURG GAMMEX PI TX LF 7.5 (GLOVE) IMPLANT
LENS IOL TECNIS EYHANCE 25.5 (Intraocular Lens) IMPLANT
NDL FILTER BLUNT 18X1 1/2 (NEEDLE) ×1 IMPLANT
NDL RETROBULBAR .5 NSTRL (NEEDLE) IMPLANT
NEEDLE FILTER BLUNT 18X1 1/2 (NEEDLE) ×1
PACK VIT ANT 23G (MISCELLANEOUS) IMPLANT
RING MALYGIN 7.0 (MISCELLANEOUS) IMPLANT
SUT ETHILON 10-0 CS-B-6CS-B-6 (SUTURE)
SUT VICRYL 9 0 (SUTURE) IMPLANT
SUTURE EHLN 10-0 CS-B-6CS-B-6 (SUTURE) IMPLANT
SYR 3ML LL SCALE MARK (SYRINGE) ×1 IMPLANT

## 2023-12-03 NOTE — Anesthesia Preprocedure Evaluation (Addendum)
Anesthesia Evaluation  Patient identified by MRN, date of birth, ID band Patient awake    Reviewed: Allergy & Precautions, H&P , NPO status , Patient's Chart, lab work & pertinent test results  Airway Mallampati: III  TM Distance: <3 FB Neck ROM: Full    Dental no notable dental hx. (+) Poor Dentition States "my teeth are in bad shape," but denies loose teeth.:   Pulmonary neg pulmonary ROS   Pulmonary exam normal breath sounds clear to auscultation       Cardiovascular hypertension, Normal cardiovascular exam Rhythm:Regular Rate:Normal     Neuro/Psych negative neurological ROS  negative psych ROS   GI/Hepatic negative GI ROS, Neg liver ROS,,,  Endo/Other  diabetes    Renal/GU negative Renal ROS  negative genitourinary   Musculoskeletal negative musculoskeletal ROS (+)    Abdominal   Peds negative pediatric ROS (+)  Hematology negative hematology ROS (+)   Anesthesia Other Findings Diabetes HTN Essential hypertension  2. SVT (supraventricular tachycardia)  3. Uncontrolled type 2 diabetes mellitus with hyperglycemia, without long-term current use of insulin (*)  4. GERD without esophagitis  5. Other cirrhosis of liver (*)  6. Coronary artery calcification seen on CT scan  7. Paresthesia of right lower extremity  8. Coronary artery disease involving native coronary artery of native heart without angina pectoris  9. Right hip pain  10. Primary osteoarthritis of right hip  11. Medication management  12. Noncompliance  Uncontrolled type 2 diabetes mellitus with hyperglycemia, without long-term current use of insulin (*) demonstrated as persistent, last A1c August 2023 was 13%.   States he is now on insulin and being compliant, states he took metoprolol "last night" and no alcohol for 18 months, "occasional" cigarette. No illicit drugs.   Reproductive/Obstetrics negative OB ROS                              Anesthesia Physical Anesthesia Plan  ASA: 3  Anesthesia Plan: MAC   Post-op Pain Management:    Induction: Intravenous  PONV Risk Score and Plan:   Airway Management Planned: Natural Airway and Nasal Cannula  Additional Equipment:   Intra-op Plan:   Post-operative Plan:   Informed Consent: I have reviewed the patients History and Physical, chart, labs and discussed the procedure including the risks, benefits and alternatives for the proposed anesthesia with the patient or authorized representative who has indicated his/her understanding and acceptance.     Dental Advisory Given  Plan Discussed with: Anesthesiologist, CRNA and Surgeon  Anesthesia Plan Comments: (Patient consented for risks of anesthesia including but not limited to:  - adverse reactions to medications - damage to eyes, teeth, lips or other oral mucosa - nerve damage due to positioning  - sore throat or hoarseness - Damage to heart, brain, nerves, lungs, other parts of body or loss of life  Patient voiced understanding and assent.)        Anesthesia Quick Evaluation

## 2023-12-03 NOTE — H&P (Signed)
[facility]   Primary Care Physician:  Lynnea Ferrier, MD Ophthalmologist: Dr. Lockie Mola  Pre-Procedure History & Physical: HPI:  Patrick Ramos is a 62 y.o. male here for ophthalmic surgery.   Past Medical History:  Diagnosis Date   Diabetes mellitus without complication (HCC)    Hypertension    Tachycardia     Past Surgical History:  Procedure Laterality Date   HERNIA REPAIR     MENISCUS REPAIR      Prior to Admission medications   Medication Sig Start Date End Date Taking? Authorizing Provider  glipiZIDE (GLUCOTROL) 10 MG tablet Take 1 tablet (10 mg total) by mouth 2 (two) times daily. 05/07/18 12/03/23 Yes Paduchowski, Caryn Bee, MD  insulin detemir (LEVEMIR) 100 UNIT/ML injection Inject 25 Units into the skin daily.   Yes [provider]  metFORMIN (GLUCOPHAGE) 500 MG tablet Take 500 mg by mouth 2 (two) times daily with a meal.   Yes [provider]  metoprolol tartrate (LOPRESSOR) 25 MG tablet Take 25 mg by mouth at bedtime.   Yes [provider]    Allergies as of 11/13/2023 - Review Complete 03/31/2021  Allergen Reaction Noted   Oxycodone-acetaminophen Other (See Comments) 05/25/2017    History reviewed. No pertinent family history.  Social History   Socioeconomic History   Marital status: Divorced    Spouse name: Not on file   Number of children: Not on file   Years of education: Not on file   Highest education level: Not on file  Occupational History   Not on file  Tobacco Use   Smoking status: Never   Smokeless tobacco: Never  Vaping Use   Vaping status: Never Used  Substance and Sexual Activity   Alcohol use: Not Currently   Drug use: Never   Sexual activity: Not on file  Other Topics Concern   Not on file  Social History Narrative   Not on file   Social Determinants of Health   Financial Resource Strain: Low Risk  (08/22/2023)   Received from [facility]   Overall Financial Resource Strain  (CARDIA)    Difficulty of Paying Living Expenses: Not hard at all  Food Insecurity: No Food Insecurity (08/22/2023)   Received from [facility]   Hunger Vital Sign    Worried About Running Out of Food in the Last Year: Never true    Ran Out of Food in the Last Year: Never true  Transportation Needs: No Transportation Needs (08/22/2023)   Received from [facility] - Transportation    Lack of Transportation (Medical): No    Lack of Transportation (Non-Medical): No  Physical Activity: Not on file  Stress: No Stress Concern Present (05/30/2023)   Received from [facility] - Occupational Stress Questionnaire    Feeling of Stress : Not at all  Social Connections: Unknown (06/26/2022)   Received from [facility]   Social Network    Social Network: Not on file  Intimate Partner Violence: Not At Risk (05/30/2023)   Received from [facility]   HITS    Over the last 12 months how often did your partner physically hurt you?: Never    Over the last 12 months how often did your partner insult you or talk down to you?: Never    Over the last 12 months how often did your partner threaten you with physical harm?: Never    Over the last 12  months how often did your partner scream or curse at you?: Never    Review of Systems: See HPI, otherwise negative ROS  Physical Exam: BP (!) 148/78   Pulse 64   Temp 98.4 F (36.9 C)   Ht 5\' 6"  (1.676 m)   Wt 72.3 kg   SpO2 99%   BMI 25.74 kg/m  General:   Alert,  pleasant and cooperative in NAD Head:  Normocephalic and atraumatic. Lungs:  Clear to auscultation.    Heart:  Regular rate and rhythm.   Impression/Plan: Patrick Ramos is here for ophthalmic surgery.  Risks, benefits, limitations, and alternatives regarding ophthalmic surgery have been reviewed with the patient.  Questions have been answered.  All parties agreeable.   Lockie Mola, MD  12/03/2023, 7:31 AM

## 2023-12-03 NOTE — Op Note (Signed)
OPERATIVE NOTE  Patrick Ramos [id number] 12/03/2023   PREOPERATIVE DIAGNOSIS:  Nuclear sclerotic cataract left eye. H25.12   POSTOPERATIVE DIAGNOSIS:    Nuclear sclerotic cataract left eye.     PROCEDURE:  Phacoemusification with posterior chamber intraocular lens placement of the left eye  Ultrasound time: Procedure(s): CATARACT EXTRACTION PHACO AND INTRAOCULAR LENS PLACEMENT (IOC) LEFT DIABETIC 9.56 00:45.7 (Left)  LENS:   Implant Name Type Inv. Item Serial No. Manufacturer Lot No. LRB No. Used Action  LENS IOL TECNIS EYHANCE 25.5 - [id number] Intraocular Lens LENS IOL TECNIS EYHANCE 25.5 [id number] [employer]  Left 1 Implanted      SURGEON:  Deirdre Evener, MD   ANESTHESIA:  Topical with tetracaine drops and 2% Xylocaine jelly, augmented with 1% preservative-free intracameral lidocaine.    COMPLICATIONS:  None.   DESCRIPTION OF PROCEDURE:  The patient was identified in the holding room and transported to the operating room and placed in the supine position under the operating microscope.  The left eye was identified as the operative eye and it was prepped and draped in the usual sterile ophthalmic fashion.   A 1 millimeter clear-corneal paracentesis was made at the 1:30 position.  0.5 ml of preservative-free 1% lidocaine was injected into the anterior chamber.  The anterior chamber was filled with Viscoat viscoelastic.  A 2.4 millimeter keratome was used to make a near-clear corneal incision at the 10:30 position.  .  A curvilinear capsulorrhexis was made with a cystotome and capsulorrhexis forceps.  Balanced salt solution was used to hydrodissect and hydrodelineate the nucleus.   Phacoemulsification was then used in stop and chop fashion to remove the lens nucleus and epinucleus.  The remaining cortex was then removed using the irrigation and aspiration handpiece. Provisc was then placed into the capsular bag to distend it for lens placement.  A lens was then injected  into the capsular bag.  The remaining viscoelastic was aspirated.   Wounds were hydrated with balanced salt solution.  The anterior chamber was inflated to a physiologic pressure with balanced salt solution.  No wound leaks were noted. Cefuroxime 0.1 ml of a 10mg /ml solution was injected into the anterior chamber for a dose of 1 mg of intracameral antibiotic at the completion of the case.   Timolol and Brimonidine drops were applied to the eye.  The patient was taken to the recovery room in stable condition without complications of anesthesia or surgery.  [name] 12/03/2023, 7:52 AM

## 2023-12-03 NOTE — Anesthesia Preprocedure Evaluation (Addendum)
Anesthesia Evaluation  Patient identified by MRN, date of birth, ID band Patient awake    Reviewed: Allergy & Precautions, H&P , NPO status , Patient's Chart, lab work & pertinent test results  Airway Mallampati: III  TM Distance: <3 FB Neck ROM: Full    Dental no notable dental hx.  States "my teeth are in bad shape," but denies loose teeth.: :   Pulmonary neg pulmonary ROS   Pulmonary exam normal breath sounds clear to auscultation       Cardiovascular hypertension, negative cardio ROS Normal cardiovascular exam Rhythm:Regular Rate:Normal  06-02-23 echLeft Ventricle: Left ventricle size is normal.   Left Ventricle: Wall thickness is normal.   Left Ventricle: EF: 60-65%.   Left Ventricle: Wall motion is normal.   Left Ventricle: Doppler parameters indicate normal diastolic function.   Right Ventricle: Right ventricle size is normal.   Right Ventricle: Systolic function is normal.   Aortic Valve: The aortic valve is tricuspid.   Aortic Valve: There is no regurgitation or stenosis.   Mitral Valve: Mitral valve structure is normal. The leaflets are mildly thickened.   Mitral Valve: There is mild to moderate regurgitation.   Tricuspid Valve: Tricuspid valve structure is normal.   Mild thickening of mitral valve but no definite vegetation seen. Narrative      Neuro/Psych negative neurological ROS  negative psych ROS   GI/Hepatic negative GI ROS, Neg liver ROS,,,  Endo/Other  negative endocrine ROSdiabetes    Renal/GU negative Renal ROS  negative genitourinary   Musculoskeletal negative musculoskeletal ROS (+)    Abdominal   Peds negative pediatric ROS (+)  Hematology negative hematology ROS (+)   Anesthesia Other Findings Previous cataract surgery 12-03-23 Dr. Juel Burrow anesthesiologist, Domenic Moras CRNA, versed 2 mg IV and fentanyl 100 mcg IV previously  Diabetes HTN Essential hypertension   2. SVT (supraventricular tachycardia)  3. Uncontrolled type 2 diabetes mellitus with hyperglycemia, without long-term current use of insulin (*)  4. GERD without esophagitis  5. Other cirrhosis of liver (*)  6. Coronary artery calcification seen on CT scan  7. Paresthesia of right lower extremity  8. Coronary artery disease involving native coronary artery of native heart without angina pectoris  9. Right hip pain  10. Primary osteoarthritis of right hip  11. Medication management  12. Noncompliance  Uncontrolled type 2 diabetes mellitus with hyperglycemia, without long-term current use of insulin (*) demonstrated as persistent, last A1c August 2023 was 13%.  Mild to moderate mitral regurgitation   States he is now on insulin and being compliant, states he takes metoprolol. States no alcohol for 18 months, "occasional" cigarette. No illicit drugs.    Reproductive/Obstetrics negative OB ROS                             Anesthesia Physical Anesthesia Plan  ASA: 3  Anesthesia Plan: MAC   Post-op Pain Management:    Induction: Intravenous  PONV Risk Score and Plan:   Airway Management Planned: Natural Airway and Nasal Cannula  Additional Equipment:   Intra-op Plan:   Post-operative Plan:   Informed Consent: I have reviewed the patients History and Physical, chart, labs and discussed the procedure including the risks, benefits and alternatives for the proposed anesthesia with the patient or authorized representative who has indicated his/her understanding and acceptance.     Dental Advisory Given  Plan Discussed with: Anesthesiologist, CRNA and Surgeon  Anesthesia Plan Comments: (Patient consented for  risks of anesthesia including but not limited to:  - adverse reactions to medications - damage to eyes, teeth, lips or other oral mucosa - nerve damage due to positioning  - sore throat or hoarseness - Damage to heart, brain, nerves, lungs, other  parts of body or loss of life  Patient voiced understanding and assent.)       Anesthesia Quick Evaluation

## 2023-12-03 NOTE — Anesthesia Postprocedure Evaluation (Signed)
Anesthesia Post Note  Patient: Patrick Ramos  Procedure(s) Performed: CATARACT EXTRACTION PHACO AND INTRAOCULAR LENS PLACEMENT (IOC) LEFT DIABETIC 9.56 00:45.7 (Left)  Patient location during evaluation: PACU Anesthesia Type: MAC Level of consciousness: awake and alert Pain management: pain level controlled Vital Signs Assessment: post-procedure vital signs reviewed and stable Respiratory status: spontaneous breathing, nonlabored ventilation, respiratory function stable and patient connected to nasal cannula oxygen Cardiovascular status: stable and blood pressure returned to baseline Postop Assessment: no apparent nausea or vomiting Anesthetic complications: no   No notable events documented.   Last Vitals:  Vitals:   12/03/23 0754 12/03/23 0759  BP: 104/63 112/73  Pulse: 85 83  Resp: (!) 7 14  Temp: (!) 36.3 C (!) 36.3 C  SpO2: 98% 99%    Last Pain:  Vitals:   12/03/23 0759  PainSc: 0-No pain                 Marcayla Budge C Ohn Bostic

## 2023-12-03 NOTE — Transfer of Care (Signed)
Immediate Anesthesia Transfer of Care Note  Patient: Patrick Ramos  Procedure(s) Performed: CATARACT EXTRACTION PHACO AND INTRAOCULAR LENS PLACEMENT (IOC) LEFT DIABETIC 9.56 00:45.7 (Left)  Patient Location: PACU  Anesthesia Type: MAC  Level of Consciousness: awake, alert  and patient cooperative  Airway and Oxygen Therapy: Patient Spontanous Breathing and Patient connected to supplemental oxygen  Post-op Assessment: Post-op Vital signs reviewed, Patient's Cardiovascular Status Stable, Respiratory Function Stable, Patent Airway and No signs of Nausea or vomiting  Post-op Vital Signs: Reviewed and stable  Complications: No notable events documented.

## 2023-12-04 ENCOUNTER — Encounter: Payer: Self-pay | Admitting: Ophthalmology

## 2023-12-04 DIAGNOSIS — H2511 Age-related nuclear cataract, right eye: Secondary | ICD-10-CM | POA: Diagnosis not present

## 2023-12-08 NOTE — Discharge Instructions (Signed)

## 2023-12-10 ENCOUNTER — Ambulatory Visit
Admission: RE | Admit: 2023-12-10 | Discharge: 2023-12-10 | Disposition: A | Discharge status: Home / Self Care | Payer: 59 | Attending: Ophthalmology | Admitting: Ophthalmology

## 2023-12-10 ENCOUNTER — Encounter
Admission: RE | Disposition: A | Discharge status: Home / Self Care | Payer: Self-pay | Source: Home / Self Care | Attending: Ophthalmology

## 2023-12-10 ENCOUNTER — Ambulatory Visit: Payer: 59 | Admitting: Anesthesiology

## 2023-12-10 ENCOUNTER — Other Ambulatory Visit: Payer: Self-pay

## 2023-12-10 ENCOUNTER — Encounter: Payer: Self-pay | Admitting: Ophthalmology

## 2023-12-10 DIAGNOSIS — I10 Essential (primary) hypertension: Secondary | ICD-10-CM | POA: Diagnosis not present

## 2023-12-10 DIAGNOSIS — E1136 Type 2 diabetes mellitus with diabetic cataract: Secondary | ICD-10-CM | POA: Insufficient documentation

## 2023-12-10 DIAGNOSIS — I471 Supraventricular tachycardia, unspecified: Secondary | ICD-10-CM | POA: Diagnosis not present

## 2023-12-10 DIAGNOSIS — H2511 Age-related nuclear cataract, right eye: Secondary | ICD-10-CM | POA: Insufficient documentation

## 2023-12-10 DIAGNOSIS — Z794 Long term (current) use of insulin: Secondary | ICD-10-CM | POA: Diagnosis not present

## 2023-12-10 DIAGNOSIS — K219 Gastro-esophageal reflux disease without esophagitis: Secondary | ICD-10-CM | POA: Diagnosis not present

## 2023-12-10 HISTORY — PX: CATARACT EXTRACTION W/PHACO: SHX586

## 2023-12-10 LAB — GLUCOSE, CAPILLARY
Glucose-Capillary: 139 mg/dL — ABNORMAL HIGH (ref 70–99)
Glucose-Capillary: 154 mg/dL — ABNORMAL HIGH (ref 70–99)

## 2023-12-10 SURGERY — PHACOEMULSIFICATION, CATARACT, WITH IOL INSERTION
Anesthesia: Monitor Anesthesia Care | Laterality: Right

## 2023-12-10 MED ORDER — BRIMONIDINE TARTRATE-TIMOLOL 0.2-0.5 % OP SOLN
OPHTHALMIC | Status: DC | PRN
  Administered 2023-12-10: 1 [drp] via OPHTHALMIC

## 2023-12-10 MED ORDER — SIGHTPATH DOSE#1 BSS IO SOLN
INTRAOCULAR | Status: DC | PRN
  Administered 2023-12-10: 58 mL via OPHTHALMIC

## 2023-12-10 MED ORDER — MIDAZOLAM HCL 2 MG/2ML IJ SOLN
INTRAMUSCULAR | Status: AC
  Filled 2023-12-10: qty 2

## 2023-12-10 MED ORDER — FENTANYL CITRATE (PF) 100 MCG/2ML IJ SOLN
INTRAMUSCULAR | Status: DC | PRN
  Administered 2023-12-10 (×2): 50 ug via INTRAVENOUS

## 2023-12-10 MED ORDER — SIGHTPATH DOSE#1 NA HYALUR & NA CHOND-NA HYALUR IO KIT
PACK | INTRAOCULAR | Status: DC | PRN
  Administered 2023-12-10: 1 via OPHTHALMIC

## 2023-12-10 MED ORDER — SIGHTPATH DOSE#1 BSS IO SOLN
INTRAOCULAR | Status: DC | PRN
  Administered 2023-12-10: 15 mL via INTRAOCULAR

## 2023-12-10 MED ORDER — TETRACAINE HCL 0.5 % OP SOLN
1.0000 [drp] | OPHTHALMIC | Status: DC | PRN
  Administered 2023-12-10 (×3): 1 [drp] via OPHTHALMIC

## 2023-12-10 MED ORDER — MIDAZOLAM HCL 2 MG/2ML IJ SOLN
INTRAMUSCULAR | Status: DC | PRN
  Administered 2023-12-10: 2 mg via INTRAVENOUS

## 2023-12-10 MED ORDER — ARMC OPHTHALMIC DILATING DROPS
OPHTHALMIC | Status: AC
Start: 2023-12-10 — End: ?
  Filled 2023-12-10: qty 0.5

## 2023-12-10 MED ORDER — FENTANYL CITRATE (PF) 100 MCG/2ML IJ SOLN
INTRAMUSCULAR | Status: AC
  Filled 2023-12-10: qty 2

## 2023-12-10 MED ORDER — TETRACAINE HCL 0.5 % OP SOLN
OPHTHALMIC | Status: AC
Start: 2023-12-10 — End: ?
  Filled 2023-12-10: qty 4

## 2023-12-10 MED ORDER — CEFUROXIME OPHTHALMIC INJECTION 1 MG/0.1 ML
INJECTION | OPHTHALMIC | Status: DC | PRN
  Administered 2023-12-10: 1 mg via INTRACAMERAL

## 2023-12-10 MED ORDER — ARMC OPHTHALMIC DILATING DROPS
1.0000 | OPHTHALMIC | Status: DC | PRN
  Administered 2023-12-10 (×3): 1 via OPHTHALMIC

## 2023-12-10 MED ORDER — SIGHTPATH DOSE#1 BSS IO SOLN
INTRAOCULAR | Status: DC | PRN
  Administered 2023-12-10: 2 mL

## 2023-12-10 SURGICAL SUPPLY — 8 items
CATARACT SUITE SIGHTPATH (MISCELLANEOUS) ×1
FEE CATARACT SUITE SIGHTPATH (MISCELLANEOUS) ×1 IMPLANT
GLOVE SRG 8 PF TXTR STRL LF DI (GLOVE) ×1 IMPLANT
GLOVE SURG ENC TEXT LTX SZ7.5 (GLOVE) ×1 IMPLANT
LENS IOL TECNIS EYHANCE 22.5 (Intraocular Lens) IMPLANT
NDL FILTER BLUNT 18X1 1/2 (NEEDLE) ×1 IMPLANT
NEEDLE FILTER BLUNT 18X1 1/2 (NEEDLE) ×1
SYR 3ML LL SCALE MARK (SYRINGE) ×1 IMPLANT

## 2023-12-10 NOTE — Op Note (Signed)
LOCATION:  [facility]   PREOPERATIVE DIAGNOSIS:    Nuclear sclerotic cataract right eye. H25.11   POSTOPERATIVE DIAGNOSIS:  Nuclear sclerotic cataract right eye.     PROCEDURE:  Phacoemusification with posterior chamber intraocular lens placement of the right eye   ULTRASOUND TIME: Procedure(s): CATARACT EXTRACTION PHACO AND INTRAOCULAR LENS PLACEMENT (IOC) RIGHT DIABETIC 8.47 00:43.0 (Right)  LENS:   Implant Name Type Inv. Item Serial No. Manufacturer Lot No. LRB No. Used Action  LENS IOL TECNIS EYHANCE 22.5 - [id number] Intraocular Lens LENS IOL TECNIS EYHANCE 22.5 [id number] [employer]  Right 1 Implanted         SURGEON:  Deirdre Evener, MD   ANESTHESIA:  Topical with tetracaine drops and 2% Xylocaine jelly, augmented with 1% preservative-free intracameral lidocaine.    COMPLICATIONS:  None.   DESCRIPTION OF PROCEDURE:  The patient was identified in the holding room and transported to the operating room and placed in the supine position under the operating microscope.  The right eye was identified as the operative eye and it was prepped and draped in the usual sterile ophthalmic fashion.   A 1 millimeter clear-corneal paracentesis was made at the 12:00 position.  0.5 ml of preservative-free 1% lidocaine was injected into the anterior chamber. The anterior chamber was filled with Viscoat viscoelastic.  A 2.4 millimeter keratome was used to make a near-clear corneal incision at the 9:00 position.  A curvilinear capsulorrhexis was made with a cystotome and capsulorrhexis forceps.  Balanced salt solution was used to hydrodissect and hydrodelineate the nucleus.   Phacoemulsification was then used in stop and chop fashion to remove the lens nucleus and epinucleus.  The remaining cortex was then removed using the irrigation and aspiration handpiece. Provisc was then placed into the capsular bag to distend it for lens placement.  A lens was then injected into the capsular  bag.  The remaining viscoelastic was aspirated.   Wounds were hydrated with balanced salt solution.  The anterior chamber was inflated to a physiologic pressure with balanced salt solution.  No wound leaks were noted. Cefuroxime 0.1 ml of a 10mg /ml solution was injected into the anterior chamber for a dose of 1 mg of intracameral antibiotic at the completion of the case.   Timolol and Brimonidine drops were applied to the eye.  The patient was taken to the recovery room in stable condition without complications of anesthesia or surgery.   Janelle Spellman 12/10/2023, 7:48 AM

## 2023-12-10 NOTE — H&P (Signed)
[facility]   Primary Care Physician:  Lynnea Ferrier, MD Ophthalmologist: Dr. Lockie Mola  Pre-Procedure History & Physical: HPI:  Patrick Ramos is a 62 y.o. male here for ophthalmic surgery.   Past Medical History:  Diagnosis Date   Diabetes mellitus without complication (HCC)    Hypertension    Tachycardia     Past Surgical History:  Procedure Laterality Date   CATARACT EXTRACTION W/PHACO Left 12/03/2023   Procedure: CATARACT EXTRACTION PHACO AND INTRAOCULAR LENS PLACEMENT (IOC) LEFT DIABETIC 9.56 00:45.7;  Surgeon: Lockie Mola, MD;  Location: [facility];  Service: Ophthalmology;  Laterality: Left;   HERNIA REPAIR     MENISCUS REPAIR      Prior to Admission medications   Medication Sig Start Date End Date Taking? Authorizing Provider  glipiZIDE (GLUCOTROL) 10 MG tablet Take 1 tablet (10 mg total) by mouth 2 (two) times daily. 05/07/18 12/10/23 Yes Paduchowski, Caryn Bee, MD  insulin detemir (LEVEMIR) 100 UNIT/ML injection Inject 25 Units into the skin daily.   Yes [provider]  metFORMIN (GLUCOPHAGE) 500 MG tablet Take 500 mg by mouth 2 (two) times daily with a meal.   Yes [provider]  metoprolol tartrate (LOPRESSOR) 25 MG tablet Take 25 mg by mouth at bedtime.   Yes [provider]    Allergies as of 11/13/2023 - Review Complete 03/31/2021  Allergen Reaction Noted   Oxycodone-acetaminophen Other (See Comments) 05/25/2017    History reviewed. No pertinent family history.  Social History   Socioeconomic History   Marital status: Divorced    Spouse name: Not on file   Number of children: Not on file   Years of education: Not on file   Highest education level: Not on file  Occupational History   Not on file  Tobacco Use   Smoking status: Never   Smokeless tobacco: Never  Vaping Use   Vaping status: Never Used  Substance and Sexual Activity   Alcohol use: Not Currently   Drug use: Never    Sexual activity: Not on file  Other Topics Concern   Not on file  Social History Narrative   Not on file   Social Drivers of Health   Financial Resource Strain: Low Risk  (08/22/2023)   Received from [facility]   Overall Financial Resource Strain (CARDIA)    Difficulty of Paying Living Expenses: Not hard at all  Food Insecurity: No Food Insecurity (08/22/2023)   Received from [facility]   Hunger Vital Sign    Worried About Running Out of Food in the Last Year: Never true    Ran Out of Food in the Last Year: Never true  Transportation Needs: No Transportation Needs (08/22/2023)   Received from [facility] - Transportation    Lack of Transportation (Medical): No    Lack of Transportation (Non-Medical): No  Physical Activity: Not on file  Stress: No Stress Concern Present (05/30/2023)   Received from [facility] - Occupational Stress Questionnaire    Feeling of Stress : Not at all  Social Connections: Unknown (06/26/2022)   Received from [facility]   Social Network    Social Network: Not on file  Intimate Partner Violence: Not At Risk (05/30/2023)   Received from [facility]   HITS    Over the last 12 months how often did your partner physically hurt you?: Never    Over the last 12 months how  often did your partner insult you or talk down to you?: Never    Over the last 12 months how often did your partner threaten you with physical harm?: Never    Over the last 12 months how often did your partner scream or curse at you?: Never    Review of Systems: See HPI, otherwise negative ROS  Physical Exam: BP (!) 144/84   Pulse 67   Temp 98.3 F (36.8 C) (Temporal)   Resp 16   Ht 5\' 6"  (1.676 m)   Wt 70.3 kg   SpO2 99%   BMI 25.02 kg/m  General:   Alert,  pleasant and cooperative in NAD Head:  Normocephalic and atraumatic. Lungs:  Clear to auscultation.    Heart:  Regular rate and rhythm.    Impression/Plan: Patrick Ramos is here for ophthalmic surgery.  Risks, benefits, limitations, and alternatives regarding ophthalmic surgery have been reviewed with the patient.  Questions have been answered.  All parties agreeable.   Lockie Mola, MD  12/10/2023, 7:29 AM

## 2023-12-10 NOTE — Anesthesia Postprocedure Evaluation (Signed)
Anesthesia Post Note  Patient: Patrick Ramos  Procedure(s) Performed: CATARACT EXTRACTION PHACO AND INTRAOCULAR LENS PLACEMENT (IOC) RIGHT DIABETIC 8.47 00:43.0 (Right)  Patient location during evaluation: PACU Anesthesia Type: MAC Level of consciousness: awake and alert Pain management: pain level controlled Vital Signs Assessment: post-procedure vital signs reviewed and stable Respiratory status: spontaneous breathing, nonlabored ventilation, respiratory function stable and patient connected to nasal cannula oxygen Cardiovascular status: stable and blood pressure returned to baseline Postop Assessment: no apparent nausea or vomiting Anesthetic complications: no   No notable events documented.   Last Vitals:  Vitals:   12/10/23 0807 12/10/23 0814  BP: (!) 93/56 (!) 95/56  Pulse: 65 65  Resp: 15 10  Temp:  36.7 C  SpO2: 97% 98%    Last Pain:  Vitals:   12/10/23 0814  TempSrc:   PainSc: 0-No pain                 Marisue Humble

## 2023-12-10 NOTE — Transfer of Care (Signed)
Immediate Anesthesia Transfer of Care Note  Patient: Patrick Ramos  Procedure(s) Performed: CATARACT EXTRACTION PHACO AND INTRAOCULAR LENS PLACEMENT (IOC) RIGHT DIABETIC 8.47 00:43.0 (Right)  Patient Location: PACU  Anesthesia Type: MAC  Level of Consciousness: awake, alert  and patient cooperative  Airway and Oxygen Therapy: Patient Spontanous Breathing and Patient connected to supplemental oxygen  Post-op Assessment: Post-op Vital signs reviewed, Patient's Cardiovascular Status Stable, Respiratory Function Stable, Patent Airway and No signs of Nausea or vomiting  Post-op Vital Signs: Reviewed and stable  Complications: No notable events documented.

## 2023-12-11 ENCOUNTER — Encounter: Payer: Self-pay | Admitting: Ophthalmology

## 2023-12-23 DIAGNOSIS — Z7984 Long term (current) use of oral hypoglycemic drugs: Secondary | ICD-10-CM | POA: Diagnosis not present

## 2023-12-23 DIAGNOSIS — Z794 Long term (current) use of insulin: Secondary | ICD-10-CM | POA: Diagnosis not present

## 2023-12-23 DIAGNOSIS — N529 Male erectile dysfunction, unspecified: Secondary | ICD-10-CM | POA: Diagnosis not present

## 2023-12-23 DIAGNOSIS — E119 Type 2 diabetes mellitus without complications: Secondary | ICD-10-CM | POA: Diagnosis not present

## 2023-12-23 DIAGNOSIS — Z833 Family history of diabetes mellitus: Secondary | ICD-10-CM | POA: Diagnosis not present

## 2023-12-23 DIAGNOSIS — Z818 Family history of other mental and behavioral disorders: Secondary | ICD-10-CM | POA: Diagnosis not present

## 2023-12-23 DIAGNOSIS — Z9849 Cataract extraction status, unspecified eye: Secondary | ICD-10-CM | POA: Diagnosis not present

## 2023-12-23 DIAGNOSIS — Z809 Family history of malignant neoplasm, unspecified: Secondary | ICD-10-CM | POA: Diagnosis not present

## 2023-12-23 DIAGNOSIS — I471 Supraventricular tachycardia, unspecified: Secondary | ICD-10-CM | POA: Diagnosis not present

## 2023-12-23 DIAGNOSIS — Z823 Family history of stroke: Secondary | ICD-10-CM | POA: Diagnosis not present

## 2023-12-30 DIAGNOSIS — Z961 Presence of intraocular lens: Secondary | ICD-10-CM | POA: Diagnosis not present

## 2024-06-29 ENCOUNTER — Encounter: Payer: Self-pay | Admitting: Family Medicine

## 2024-06-29 ENCOUNTER — Ambulatory Visit (INDEPENDENT_AMBULATORY_CARE_PROVIDER_SITE_OTHER): Admitting: Family Medicine

## 2024-06-29 VITALS — BP 126/59 | HR 56 | Temp 97.6°F | Ht 66.0 in | Wt 170.0 lb

## 2024-06-29 DIAGNOSIS — E1140 Type 2 diabetes mellitus with diabetic neuropathy, unspecified: Secondary | ICD-10-CM

## 2024-06-29 DIAGNOSIS — E1159 Type 2 diabetes mellitus with other circulatory complications: Secondary | ICD-10-CM

## 2024-06-29 DIAGNOSIS — Z1211 Encounter for screening for malignant neoplasm of colon: Secondary | ICD-10-CM

## 2024-06-29 DIAGNOSIS — R202 Paresthesia of skin: Secondary | ICD-10-CM

## 2024-06-29 DIAGNOSIS — Z794 Long term (current) use of insulin: Secondary | ICD-10-CM

## 2024-06-29 DIAGNOSIS — R200 Anesthesia of skin: Secondary | ICD-10-CM

## 2024-06-29 DIAGNOSIS — F17200 Nicotine dependence, unspecified, uncomplicated: Secondary | ICD-10-CM

## 2024-06-29 DIAGNOSIS — Z8619 Personal history of other infectious and parasitic diseases: Secondary | ICD-10-CM

## 2024-06-29 DIAGNOSIS — M79661 Pain in right lower leg: Secondary | ICD-10-CM

## 2024-06-29 DIAGNOSIS — M79662 Pain in left lower leg: Secondary | ICD-10-CM

## 2024-06-29 DIAGNOSIS — Z7689 Persons encountering health services in other specified circumstances: Secondary | ICD-10-CM

## 2024-06-29 DIAGNOSIS — I152 Hypertension secondary to endocrine disorders: Secondary | ICD-10-CM

## 2024-06-29 DIAGNOSIS — Z1159 Encounter for screening for other viral diseases: Secondary | ICD-10-CM

## 2024-06-29 DIAGNOSIS — M51362 Other intervertebral disc degeneration, lumbar region with discogenic back pain and lower extremity pain: Secondary | ICD-10-CM

## 2024-06-29 MED ORDER — GABAPENTIN 100 MG PO CAPS: 100.0000 mg | ORAL_CAPSULE | Freq: Three times a day (TID) | ORAL | 3 refills | Status: DC

## 2024-06-29 NOTE — Progress Notes (Signed)
 New Patient Office Visit  Introduced to nurse practitioner role and practice setting.  All questions answered.  Discussed provider/patient relationship and expectations.   Subjective    Patient ID: Patrick Ramos, male    DOB: October 24, 1961  Age: 63 y.o. MRN: [id number]  CC:  Chief Complaint  Patient presents with   New Patient (Initial Visit)    Patient is here to establish care with a [facility] because he can no longer go to [facility] due to the insurance that he has, it is a higher copay.  Last year he had sepsis his legs hurts really bad from thighs down to his knees.  Wants to know if with having sepsis could have made him have nerve damage.   HPI Discussed the use of AI scribe software for clinical note transcription with the patient, who gave verbal consent to proceed.  History of Present Illness Patrick Ramos is a 63 year old male with diabetes and hypertension who presents for establishment of care and evaluation of leg pain.  He experiences leg pain and numbness, which he associates with a previous sepsis episode. The pain is described as shooting and electrical, primarily affecting his legs and occasionally his arms. He experiences numbness in his feet, similar to the sensation after dental anesthesia, where he can feel touch but not fully perceive it. This numbness is not constant but occurs more often than not.  His diabetes is managed with glipizide  5 mg twice daily, Lantus 25 units daily, and metformin 1000 mg twice daily. His last A1c was 8.3, but he reports a more recent average of 6.5 to 6.8 over the past 90 days with his freestyle [place].  He experiences numbness and occasional pain in his feet, which may be related to diabetic neuropathy.  Hypertension is managed with lisinopril 5 mg and metoprolol 25 mg, the latter also addressing his tachycardia. He takes a daily aspirin for prevention of CAD.  He has a history of back problems, including degenerative disc  disease and a partially ruptured disc, which have been managed with physical therapy and past injections. He experiences variable back pain, which can be influenced by weather changes.  He has a history of cataract surgery on both eyes and maintains regular eye exams due to his diabetes. He also reports a history of fatty liver disease, which he notes is familial, as his mother also has it.  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 He enjoys jogging and playing golf, although his leg pain has affected his ability to run as he used to. He has two daughters, one of whom lives nearby, and a grandchild on the way. He lives with a cat named Callie.  He uses tobacco pouches a few times a day. He brushes his teeth twice daily and plans to address dental issues soon. He has a history of hernia repair and meniscus repair on his right knee, which was successfully treated after an initial unsuccessful surgery.  Outpatient Encounter Medications as of 06/29/2024  Medication Sig   aspirin EC 81 MG tablet Take 81 mg by mouth daily. Swallow whole.   gabapentin  (NEURONTIN ) 100 MG capsule Take 1 capsule (100 mg total) by mouth 3 (three) times daily.   glipiZIDE  (GLUCOTROL ) 10 MG tablet Take 1 tablet (10 mg total) by mouth 2 (two) times daily. (Patient taking differently: Take 5 mg by mouth 2 (two) times daily.)   insulin glargine (LANTUS) 100  UNIT/ML injection Inject 25 Units into the skin daily.   lisinopril (ZESTRIL) 5 MG tablet Take 5 mg by mouth daily.   metFORMIN (GLUCOPHAGE) 500 MG tablet Take 500 mg by mouth 2 (two) times daily with a meal.   metoprolol tartrate (LOPRESSOR) 25 MG tablet Take 25 mg by mouth at bedtime.   [DISCONTINUED] insulin detemir (LEVEMIR) 100 UNIT/ML injection Inject 25 Units into the skin daily. (Patient not taking: Reported on 06/29/2024)   No facility-administered encounter medications on file as of 06/29/2024.    Past Medical History:   Diagnosis Date   Diabetes mellitus without complication (HCC)    Hypertension    Tachycardia     Past Surgical History:  Procedure Laterality Date   CATARACT EXTRACTION W/PHACO Left 12/03/2023   Procedure: CATARACT EXTRACTION PHACO AND INTRAOCULAR LENS PLACEMENT (IOC) LEFT DIABETIC 9.56 00:45.7;  Surgeon: Mittie Gaskin, MD;  Location: [facility];  Service: Ophthalmology;  Laterality: Left;   CATARACT EXTRACTION W/PHACO Right 12/10/2023   Procedure: CATARACT EXTRACTION PHACO AND INTRAOCULAR LENS PLACEMENT (IOC) RIGHT DIABETIC 8.47 00:43.0;  Surgeon: Mittie Gaskin, MD;  Location: [facility];  Service: Ophthalmology;  Laterality: Right;   HERNIA REPAIR     MENISCUS REPAIR      Family History  Problem Relation Age of Onset   Cancer Father        Lung   Diabetes Father    Cancer Other     Social History   Socioeconomic History   Marital status: Single    Spouse name: Not on file   Number of children: Not on file   Years of education: Not on file   Highest education level: Not on file  Occupational History   Not on file  Tobacco Use   Smoking status: Never   Smokeless tobacco: Current    Types: Chew   Tobacco comments:    Pouches and he said he is wanting to quit in the future.  Vaping Use   Vaping status: Never Used  Substance and Sexual Activity   Alcohol use: Not Currently    Comment: Quit drinking 2 years ago.   Drug use: Never   Sexual activity: Not Currently  Other Topics Concern   Not on file  Social History Narrative   Not on file   Social Drivers of Health   Financial Resource Strain: Low Risk  (01/26/2024)   Received from [facility]   Overall Financial Resource Strain (CARDIA)    Difficulty of Paying Living Expenses: Not hard at all  Food Insecurity: No Food Insecurity (01/26/2024)   Received from [facility]   Hunger Vital Sign    Within the past 12 months, you worried that your food  would run out before you got the money to buy more.: Never true    Within the past 12 months, the food you bought just didn't last and you didn't have money to get more.: Never true  Transportation Needs: No Transportation Needs (01/26/2024)   Received from [facility] - Transportation    In the past 12 months, has lack of transportation kept you from medical appointments or from getting medications?: No    Lack of Transportation (Non-Medical): No  Physical Activity: Not on file  Stress: No Stress Concern Present (05/30/2023)   Received from [facility] - Occupational Stress Questionnaire    Feeling of Stress : Not at all  Social Connections: Unknown (06/26/2022)   Received from [facility]   Social Network    Social Network: Not on file  Intimate Partner Violence: Not At Risk (05/30/2023)   Received from [facility]   HITS    Over the last 12 months how often did your partner physically hurt you?: Never    Over the last 12 months how often did your partner insult you or talk down to you?: Never    Over the last 12 months how often did your partner threaten you with physical harm?: Never    Over the last 12 months how often did your partner scream or curse at you?: Never   Review of Systems  All other systems reviewed and are negative.      Objective    BP (!) 126/59 (BP Location: Left Arm, Cuff Size: Normal)   Pulse (!) 56   Temp 97.6 F (36.4 C) (Oral)   Ht 5' 6 (1.676 m)   Wt 170 lb (77.1 kg)   SpO2 100%   BMI 27.44 kg/m   Physical Exam Constitutional:      General: He is not in acute distress.    Appearance: Normal appearance. He is not ill-appearing, toxic-appearing or diaphoretic.  HENT:     Head: Normocephalic.     Nose: Nose normal.     Mouth/Throat:     Mouth: Mucous membranes are moist.     Dentition: Abnormal dentition.  Eyes:     Extraocular Movements: Extraocular movements intact.      Conjunctiva/sclera: Conjunctivae normal.     Pupils: Pupils are equal, round, and reactive to light.  Cardiovascular:     Rate and Rhythm: Normal rate and regular rhythm.     Pulses:          Radial pulses are 2+ on the right side and 2+ on the left side.       Dorsalis pedis pulses are 2+ on the right side and 2+ on the left side.       Posterior tibial pulses are 2+ on the right side and 2+ on the left side.     Heart sounds: No murmur heard.    No friction rub. No gallop.  Pulmonary:     Effort: Pulmonary effort is normal. No tachypnea, bradypnea, accessory muscle usage, prolonged expiration or respiratory distress.     Breath sounds: Normal breath sounds. No stridor. No wheezing, rhonchi or rales.  Chest:     Chest wall: No tenderness.  Musculoskeletal:        General: Tenderness present.     Lumbar back: Tenderness present. No signs of trauma or spasms. Normal range of motion. Positive right straight leg raise test. Negative left straight leg raise test.     Right lower leg: No edema.     Left lower leg: No edema.     Comments: Tenderness to back with straight leg raise - baseline  Skin:    General: Skin is warm and dry.     Capillary Refill: Capillary refill takes less than 2 seconds.  Neurological:     General: No focal deficit present.     Mental Status: He is alert and oriented to person, place, and time. Mental status is at baseline.     GCS: GCS eye subscore is 4. GCS verbal subscore is 5. GCS motor subscore is 6.     Cranial Nerves: Cranial nerves 2-12 are intact. No cranial nerve deficit.     Sensory:  Sensory deficit present.     Motor: No weakness.     Coordination: Coordination normal.     Gait: Gait normal.     Comments: Numbness/tingling on fingers and feet         Assessment & Plan:  Assessment and Plan Assessment & Plan Diabetes Mellitus Type 2 with Neuropathy Type 2 Diabetes Mellitus managed with glipizide , Lantus, and metformin. Last A1c 8.3,  indicating uncontrolled diabetes. Reports 30-day average of 6.5 with Freestyle Libre, suggesting improved control. Neuropathy likely due to diabetes vs vascular damage post sepsis, with intermittent numbness and shooting pains in legs and feet. Gabapentin  discussed and agreed upon for treatment. - Order labs: A1c, Urine Micro kidney function, B12 levels. - Prescribe gabapentin  100 mg TID - Order leg ultrasound for vascular assessment. - Foot exam due - next visit - Ensure annual diabetic eye exams. - on ACE - Continue to make conscious decisions for well balanced diet smaller portions with increase protein, fruits, veggies, water as drink of choice, decrease starches, processed foods, and saturated fats. Increase weekly exercise - 150 minutes per week.    Hypertension On lisinopril 5 mg daily and metoprolol 25 mg daily.  Blood pressure well-controlled. 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 Intermittent severe back pain. Previously had physical therapy and injections. Prefers conservative management over surgery. - Denies any management at this time  Tobacco Use Stopped cigarettes Uses oral nicotine pouches  Recommend cessation - pre-contemplation Recommend dentist for oral CA screening  Numbness/Tingling Upper and lower extremities Hx of sepsis one year ago  Vascular abnormalities vs neuropathy Will start gabapentin  100mg  TID Associated with BLE pain - order lower extremity US  Will order vitamin b12, A1c, TSH, CMP  General Health Maintenance Due for routine screenings. History of cataract surgery. Uses tobacco pouches, aware of oral cancer risks. Prefers Cologuard for colorectal cancer screening. - Order HIV and Hepatitis C screening. - Order Cologuard for colorectal cancer screening. -  Advise tobacco cessation, recommend dental visits for oral cancer screening.   Type 2 diabetes mellitus with diabetic neuropathy, with long-term current use of insulin (HCC) -     Comprehensive metabolic panel with GFR -     Hemoglobin A1c -     Microalbumin / creatinine urine ratio -     Lipid panel  Hypertension associated with diabetes (HCC) -     Comprehensive metabolic panel with GFR -     Hemoglobin A1c  Numbness and tingling -     CBC -     Vitamin B12 -     VITAMIN D  25 Hydroxy (Vit-D Deficiency, Fractures) -     TSH -     Gabapentin ; Take 1 capsule (100 mg total) by mouth 3 (three) times daily.  Dispense: 90 capsule; Refill: 3 -     US  ARTERIAL LOWER EXTREMITY DUPLEX BILATERAL; Future  Pain in both lower legs -     US  ARTERIAL LOWER EXTREMITY DUPLEX BILATERAL; Future  Tobacco use disorder, moderate, dependence  History of sepsis -     US  ARTERIAL LOWER EXTREMITY DUPLEX BILATERAL; Future  Screening for viral disease -     Hepatitis C antibody -     HIV Antibody (routine testing w rflx)  Screening for colon cancer -     Cologuard  Encounter to establish care with new doctor  Return in about 3 months (around 09/29/2024) for chronic disease mgmt.   I, Curtis DELENA Boom, FNP, have reviewed all documentation for this visit. The documentation on 06/29/24 for the exam, diagnosis, procedures, and orders are all accurate and complete.   Curtis DELENA Boom, FNP

## 2024-06-30 LAB — COMPREHENSIVE METABOLIC PANEL WITH GFR
ALT: 48 IU/L — ABNORMAL HIGH (ref 0–44)
AST: 56 IU/L — ABNORMAL HIGH (ref 0–40)
Albumin: 3.4 g/dL — ABNORMAL LOW (ref 3.9–4.9)
Alkaline Phosphatase: 80 IU/L (ref 44–121)
BUN/Creatinine Ratio: 11 (ref 10–24)
BUN: 11 mg/dL (ref 8–27)
Bilirubin Total: 0.8 mg/dL (ref 0.0–1.2)
CO2: 20 mmol/L (ref 20–29)
Calcium: 9 mg/dL (ref 8.6–10.2)
Chloride: 104 mmol/L (ref 96–106)
Creatinine, Ser: 0.99 mg/dL (ref 0.76–1.27)
Globulin, Total: 3.6 g/dL (ref 1.5–4.5)
Glucose: 147 mg/dL — ABNORMAL HIGH (ref 70–99)
Potassium: 4.2 mmol/L (ref 3.5–5.2)
Sodium: 137 mmol/L (ref 134–144)
Total Protein: 7 g/dL (ref 6.0–8.5)
eGFR: 86 mL/min/1.73 (ref >59)

## 2024-06-30 LAB — LIPID PANEL
Chol/HDL Ratio: 3.9 ratio (ref 0.0–5.0)
Cholesterol, Total: 171 mg/dL (ref 100–199)
HDL: 44 mg/dL (ref >39)
LDL Chol Calc (NIH): 96 mg/dL (ref 0–99)
Triglycerides: 181 mg/dL — ABNORMAL HIGH (ref 0–149)
VLDL Cholesterol Cal: 31 mg/dL (ref 5–40)

## 2024-06-30 LAB — HEMOGLOBIN A1C
Est. average glucose Bld gHb Est-mCnc: 146 mg/dL
Hgb A1c MFr Bld: 6.7 % — ABNORMAL HIGH (ref 4.8–5.6)

## 2024-06-30 LAB — HEPATITIS C ANTIBODY: Hep C Virus Ab: NONREACTIVE

## 2024-06-30 LAB — MICROALBUMIN / CREATININE URINE RATIO
Creatinine, Urine: 68.8 mg/dL
Microalb/Creat Ratio: <4 mg/g{creat} (ref 0–29)
Microalbumin, Urine: <3 ug/mL

## 2024-06-30 LAB — CBC
Hematocrit: 37.5 % (ref 37.5–51.0)
Hemoglobin: 12.9 g/dL — ABNORMAL LOW (ref 13.0–17.7)
MCH: 32.7 pg (ref 26.6–33.0)
MCHC: 34.4 g/dL (ref 31.5–35.7)
MCV: 95 fL (ref 79–97)
Platelets: 125 x10E3/uL — ABNORMAL LOW (ref 150–450)
RBC: 3.95 x10E6/uL — ABNORMAL LOW (ref 4.14–5.80)
RDW: 13 % (ref 11.6–15.4)
WBC: 5.3 x10E3/uL (ref 3.4–10.8)

## 2024-06-30 LAB — TSH: TSH: 4.27 u[IU]/mL (ref 0.450–4.500)

## 2024-06-30 LAB — HIV ANTIBODY (ROUTINE TESTING W REFLEX): HIV Screen 4th Generation wRfx: NONREACTIVE

## 2024-06-30 LAB — VITAMIN B12: Vitamin B-12: 688 pg/mL (ref 232–1245)

## 2024-06-30 LAB — VITAMIN D 25 HYDROXY (VIT D DEFICIENCY, FRACTURES): Vit D, 25-Hydroxy: 26.1 ng/mL — ABNORMAL LOW (ref 30.0–100.0)

## 2024-07-01 ENCOUNTER — Ambulatory Visit: Payer: Self-pay | Admitting: Family Medicine

## 2024-07-05 ENCOUNTER — Ambulatory Visit
Admission: RE | Admit: 2024-07-05 | Discharge: 2024-07-05 | Disposition: A | Discharge status: Home / Self Care | Source: Ambulatory Visit | Attending: Family Medicine | Admitting: Family Medicine

## 2024-07-05 DIAGNOSIS — R200 Anesthesia of skin: Secondary | ICD-10-CM | POA: Insufficient documentation

## 2024-07-05 DIAGNOSIS — M79661 Pain in right lower leg: Secondary | ICD-10-CM | POA: Insufficient documentation

## 2024-07-05 DIAGNOSIS — R202 Paresthesia of skin: Secondary | ICD-10-CM | POA: Diagnosis present

## 2024-07-05 DIAGNOSIS — Z8619 Personal history of other infectious and parasitic diseases: Secondary | ICD-10-CM | POA: Insufficient documentation

## 2024-07-05 DIAGNOSIS — M79662 Pain in left lower leg: Secondary | ICD-10-CM | POA: Diagnosis present

## 2024-07-06 ENCOUNTER — Telehealth: Payer: Self-pay | Admitting: Family Medicine

## 2024-07-06 NOTE — Telephone Encounter (Signed)
 Have attempted to reach pt, left voicemail. If patient returns call please find out what questions he has about US  results.

## 2024-07-06 NOTE — Telephone Encounter (Unsigned)
 Copied from CRM [id number]. Topic: Clinical - Medical Advice >> Jul 06, 2024 12:07 PM Tiffini S wrote: Reason for CRM: Patient was given the results for labs and leg ultrasounds. Patient have questions about the legs :  Mild vascular changes in tibial region of leg. No blockage of blood flow found. Please call at [phone].

## 2024-07-23 LAB — COLOGUARD: COLOGUARD: NEGATIVE

## 2024-09-30 ENCOUNTER — Ambulatory Visit: Payer: Self-pay | Admitting: Family Medicine

## 2024-09-30 ENCOUNTER — Encounter: Payer: Self-pay | Admitting: Family Medicine

## 2024-09-30 ENCOUNTER — Ambulatory Visit (INDEPENDENT_AMBULATORY_CARE_PROVIDER_SITE_OTHER): Admitting: Family Medicine

## 2024-09-30 VITALS — BP 110/68 | HR 63 | Wt 170.1 lb

## 2024-09-30 DIAGNOSIS — I152 Hypertension secondary to endocrine disorders: Secondary | ICD-10-CM

## 2024-09-30 DIAGNOSIS — Z794 Long term (current) use of insulin: Secondary | ICD-10-CM

## 2024-09-30 DIAGNOSIS — F17200 Nicotine dependence, unspecified, uncomplicated: Secondary | ICD-10-CM | POA: Insufficient documentation

## 2024-09-30 DIAGNOSIS — E1142 Type 2 diabetes mellitus with diabetic polyneuropathy: Secondary | ICD-10-CM | POA: Insufficient documentation

## 2024-09-30 DIAGNOSIS — E1140 Type 2 diabetes mellitus with diabetic neuropathy, unspecified: Secondary | ICD-10-CM | POA: Diagnosis not present

## 2024-09-30 DIAGNOSIS — E1159 Type 2 diabetes mellitus with other circulatory complications: Secondary | ICD-10-CM | POA: Diagnosis not present

## 2024-09-30 LAB — POCT GLYCOSYLATED HEMOGLOBIN (HGB A1C)
Est. average glucose Bld gHb Est-mCnc: 151
Hemoglobin A1C: 6.9 % — AB (ref 4.0–5.6)

## 2024-09-30 MED ORDER — INSULIN GLARGINE 100 UNIT/ML ~~LOC~~ SOLN: 25.0000 [IU] | Freq: Every day | SUBCUTANEOUS | 3 refills | Status: DC

## 2024-09-30 NOTE — Progress Notes (Signed)
 Established Patient Office Visit  Introduced to nurse practitioner role and practice setting.  All questions answered.  Discussed provider/patient relationship and expectations.   Subjective   Patient ID: Patrick Ramos, male    DOB: 11/11/1961  Age: 62 y.o. MRN: [id number]  Chief Complaint  Patient presents with   Medical Management of Chronic Issues    T2DM Needs medication refills Patient declined flu vaccine   Discussed the use of AI scribe software for clinical note transcription with the patient, who gave verbal consent to proceed.  History of Present Illness Patrick Ramos is a 63 year old male with type 2 diabetes, neuropathy, and hypertension who presents for chronic disease follow-up.  He has type 2 diabetes managed with glipizide  5 mg twice daily, metformin 500 mg twice daily, and glargine insulin 25 units at night. His A1c is 6.9, slightly elevated from the previous 6.7. Blood sugars are more elevated at night after meals, reaching the 200s.  For neuropathy, he was started on gabapentin  100 mg three times a day, but he only takes it twice a day. Despite this, he reports significant improvement in pain, numbness, and tingling.  His hypertension is managed with lisinopril 5 mg daily and metoprolol 25 mg at night.  He has a history of tobacco use disorder.        09/30/2024    9:08 AM 06/29/2024   10:10 AM  Depression screen PHQ 2/9  Decreased Interest 0 0  Down, Depressed, Hopeless 0 0  PHQ - 2 Score 0 0  Altered sleeping 2 2  Tired, decreased energy 1 1  Change in appetite 1 0  Feeling bad or failure about yourself  0 0  Trouble concentrating 0 0  Moving slowly or fidgety/restless 0 0  Suicidal thoughts 0 0  PHQ-9 Score 4 3  Difficult doing work/chores Not difficult at all Not difficult at all       09/30/2024    9:08 AM 06/29/2024   10:10 AM  GAD 7 : Generalized Anxiety Score  Nervous, Anxious, on Edge 0 0  Control/stop worrying 0 0  Worry too much  - different things 0 0  Trouble relaxing 1 0  Restless 0 0  Easily annoyed or irritable 0 0  Afraid - awful might happen 0 0  Total GAD 7 Score 1 0  Anxiety Difficulty Not difficult at all Not difficult at all     ROS  Negative unless indicated in HPI   Objective:     BP 110/68 (BP Location: Left Arm, Patient Position: Sitting, Cuff Size: Normal)   Pulse 63   Wt 170 lb 1.6 oz (77.2 kg)   SpO2 100%   BMI 27.45 kg/m    Physical Exam Constitutional:      General: He is not in acute distress.    Appearance: Normal appearance. He is not ill-appearing, toxic-appearing or diaphoretic.  HENT:     Head: Normocephalic.     Nose: Nose normal.     Mouth/Throat:     Mouth: Mucous membranes are moist.  Eyes:     Extraocular Movements: Extraocular movements intact.     Conjunctiva/sclera: Conjunctivae normal.     Pupils: Pupils are equal, round, and reactive to light.  Cardiovascular:     Rate and Rhythm: Normal rate and regular rhythm.     Heart sounds: No murmur heard.    No friction rub. No gallop.  Pulmonary:     Effort: Pulmonary effort is normal.  No respiratory distress.     Breath sounds: Normal breath sounds. No stridor. No wheezing, rhonchi or rales.  Chest:     Chest wall: No tenderness.  Musculoskeletal:     Right lower leg: No edema.     Left lower leg: No edema.  Skin:    General: Skin is warm and dry.     Capillary Refill: Capillary refill takes less than 2 seconds.  Neurological:     General: No focal deficit present.     Mental Status: He is alert and oriented to person, place, and time. Mental status is at baseline.     Cranial Nerves: No cranial nerve deficit.     Sensory: No sensory deficit.     Motor: No weakness.     Coordination: Coordination normal.     Gait: Gait normal.      Results for orders placed or performed in visit on 09/30/24  POCT glycosylated hemoglobin (Hb A1C)  Result Value Ref Range   Hemoglobin A1C 6.9 (A) 4.0 - 5.6 %   Est.  average glucose Bld gHb Est-mCnc 151       The 10-year ASCVD risk score (Arnett DK, et al., 2019) is: 17.4%    Assessment & Plan:  Type 2 diabetes mellitus with diabetic neuropathy, with long-term current use of insulin (HCC) -     POCT glycosylated hemoglobin (Hb A1C) -     Insulin Glargine; Inject 0.25 mLs (25 Units total) into the skin daily.  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 Postprandial blood sugars are elevated in the 200s. Diabetic neuropathy symptoms have improved significantly with gabapentin , and he is satisfied with the current management. - Fasting BG goal 80 - 130 - post prandial two hours goal <180 - Recommend referral to nutrition - wants to hold today - Continue CGM - discussed snacks before bed to decrease risk of hypoglycemia - Increase sugars post prandial, adjust lean proteins, healthy carbs like fruits, veggies, limit pastas, starches, breads, rice - Continue glipizide  5 mg twice daily. - Continue metformin 500 mg twice daily with meals. - Continue glargine insulin 25 units at night. - Continue gabapentin  100 mg three times a day for peripheral neuropathy - on ACE - Referral for optho - Recommend foot exam at next visit - Plan to repeat A1C, lipid panel, CMP at next visit  Peripheral Neuropathy - much improved with starting gabapentin  100mg  - pt takes twice a day, may take TID - BLE US  was normal  Hypertension - Chronic, controlled - GOAL<130/80 - Continue lisinopril and metoprolol.  Tobacco use disorder - chronic - continue to work on goals of cessation   Return  in about 3 months (around 12/31/2024) for HTN & DMII; a1c check.   I, Curtis DELENA Boom, FNP, have reviewed all documentation for this visit. The documentation on 09/30/24 for the exam, diagnosis, procedures, and orders are all accurate and complete.   Curtis DELENA Boom, FNP

## 2024-10-05 ENCOUNTER — Other Ambulatory Visit: Payer: Self-pay

## 2024-10-05 ENCOUNTER — Telehealth: Payer: Self-pay

## 2024-10-05 MED ORDER — FREESTYLE LIBRE 3 SENSOR MISC: 2.0000 | 1 refills | Status: DC

## 2024-10-05 NOTE — Telephone Encounter (Signed)
 I will send in the CGM but changes to the insulin I will need to have you ok.     Copied from CRM [id number]. Topic: Clinical - Prescription Issue >> Oct 05, 2024 11:55 AM Winona R wrote: Pt states he needs the pen form of insulin injection and not the vial and needle separate. Please send it into [facility] [address] [place], [place] - [address], pt also states the pharmacy told him he will need Continuous Glucose Sensor (FREESTYLE LIBRE 3 SENSOR) MISC

## 2024-10-07 ENCOUNTER — Other Ambulatory Visit: Payer: Self-pay | Admitting: Family Medicine

## 2024-10-07 DIAGNOSIS — E1140 Type 2 diabetes mellitus with diabetic neuropathy, unspecified: Secondary | ICD-10-CM

## 2024-10-07 MED ORDER — LANTUS SOLOSTAR 100 UNIT/ML ~~LOC~~ SOPN: 25.0000 [IU] | PEN_INJECTOR | Freq: Every day | SUBCUTANEOUS | 3 refills | Status: DC

## 2024-11-26 ENCOUNTER — Other Ambulatory Visit: Payer: Self-pay

## 2024-11-26 NOTE — Telephone Encounter (Signed)
 Copied from CRM #[id number]. Topic: Clinical - Medication Refill >> Nov 26, 2024  2:32 PM Rosaria E wrote: Medication: glipiZIDE  (GLUCOTROL ) 10 MG tablet lisinopril (ZESTRIL) 5 MG tablet  Pt is completely out of his current supply.  Has the patient contacted their pharmacy? Yes (Agent: If no, request that the patient contact the pharmacy for the refill. If patient does not wish to contact the pharmacy document the reason why and proceed with request.) (Agent: If yes, when and what did the pharmacy advise?)  This is the patient's preferred pharmacy:  [facility] [address] [place] - [address] [place] [address] Phone: [phone] Fax: [phone]  Is this the correct pharmacy for this prescription? Yes If no, delete pharmacy and type the correct one.   Has the prescription been filled recently? Yes  Is the patient out of the medication? Yes.  Has the patient been seen for an appointment in the last year OR does the patient have an upcoming appointment? Yes  Can we respond through MyChart? Yes   Agent: Please be advised that Rx refills may take up to 3 business days. We ask that you follow-up with your pharmacy.

## 2024-11-26 NOTE — Telephone Encounter (Signed)
 Requested medication (s) are due for refill today: Yes  Requested medication (s) are on the active medication list: Yes  Last refill:  05/07/18, 06/29/24   Future visit scheduled: Yes  Notes to clinic:  Unable to refill per protocol, last refill by another provider.      Requested Prescriptions  Pending Prescriptions Disp Refills   glipiZIDE  (GLUCOTROL ) 10 MG tablet 60 tablet 0    Sig: Take 1 tablet (10 mg total) by mouth 2 (two) times daily.     Endocrinology:  Diabetes - Sulfonylureas Passed - 11/26/2024  7:37 PM      Passed - HBA1C is between 0 and 7.9 and within 180 days    Hemoglobin A1C  Date Value Ref Range Status  09/30/2024 6.9 (A) 4.0 - 5.6 % Final   Hgb A1c MFr Bld  Date Value Ref Range Status  06/29/2024 6.7 (H) 4.8 - 5.6 % Final    Comment:             Prediabetes: 5.7 - 6.4          Diabetes: >6.4          Glycemic control for adults with diabetes: <7.0          Passed - Cr in normal range and within 360 days    Creatinine, Ser  Date Value Ref Range Status  06/29/2024 0.99 0.76 - 1.27 mg/dL Final         Passed - Valid encounter within last 6 months    Recent Outpatient Visits           1 month ago Type 2 diabetes mellitus with diabetic neuropathy, with long-term current use of insulin  [facility])   [facility] [place], Curtis A, FNP   5 months ago Type 2 diabetes mellitus with diabetic neuropathy, with long-term current use of insulin  (HCC)   [place] [place], Kellie A, FNP               lisinopril (ZESTRIL) 5 MG tablet      Sig: Take 1 tablet (5 mg total) by mouth daily.     Cardiovascular:  ACE Inhibitors Passed - 11/26/2024  7:37 PM      Passed - Cr in normal range and within 180 days    Creatinine, Ser  Date Value Ref Range Status  06/29/2024 0.99 0.76 - 1.27 mg/dL Final         Passed - K in normal range and within 180 days    Potassium  Date Value Ref Range Status  06/29/2024 4.2  3.5 - 5.2 mmol/L Final         Passed - Patient is not pregnant      Passed - Last BP in normal range    BP Readings from Last 1 Encounters:  09/30/24 110/68         Passed - Valid encounter within last 6 months    Recent Outpatient Visits           1 month ago Type 2 diabetes mellitus with diabetic neuropathy, with long-term current use of insulin  [facility])   [facility] [place], Curtis A, FNP   5 months ago Type 2 diabetes mellitus with diabetic neuropathy, with long-term current use of insulin  [facility])   [facility] [place], Curtis LABOR, [place]

## 2024-11-29 ENCOUNTER — Ambulatory Visit: Payer: Self-pay

## 2024-11-29 ENCOUNTER — Other Ambulatory Visit: Payer: Self-pay | Admitting: Family Medicine

## 2024-11-29 MED ORDER — GLIPIZIDE 10 MG PO TABS: 10.0000 mg | ORAL_TABLET | Freq: Two times a day (BID) | ORAL | 0 refills | Status: DC

## 2024-11-29 MED ORDER — LISINOPRIL 5 MG PO TABS: 5.0000 mg | ORAL_TABLET | Freq: Every day | ORAL | 1 refills | Status: AC

## 2024-11-29 NOTE — Telephone Encounter (Signed)
 Duplicate request- filled 11/29/24 for both Requested Prescriptions  Pending Prescriptions Disp Refills   glipiZIDE  (GLUCOTROL ) 10 MG tablet 60 tablet 0    Sig: Take 1 tablet (10 mg total) by mouth 2 (two) times daily.     Endocrinology:  Diabetes - Sulfonylureas Passed - 11/29/2024  4:28 PM      Passed - HBA1C is between 0 and 7.9 and within 180 days    Hemoglobin A1C  Date Value Ref Range Status  09/30/2024 6.9 (A) 4.0 - 5.6 % Final   Hgb A1c MFr Bld  Date Value Ref Range Status  06/29/2024 6.7 (H) 4.8 - 5.6 % Final    Comment:             Prediabetes: 5.7 - 6.4          Diabetes: >6.4          Glycemic control for adults with diabetes: <7.0          Passed - Cr in normal range and within 360 days    Creatinine, Ser  Date Value Ref Range Status  06/29/2024 0.99 0.76 - 1.27 mg/dL Final         Passed - Valid encounter within last 6 months    Recent Outpatient Visits           2 months ago Type 2 diabetes mellitus with diabetic neuropathy, with long-term current use of insulin  [facility])   [facility] [place], Curtis A, FNP   5 months ago Type 2 diabetes mellitus with diabetic neuropathy, with long-term current use of insulin  (HCC)   [place] [facility] [place], Weston A, FNP               lisinopril  (ZESTRIL ) 5 MG tablet 90 tablet 1    Sig: Take 1 tablet (5 mg total) by mouth daily.     Cardiovascular:  ACE Inhibitors Passed - 11/29/2024  4:28 PM      Passed - Cr in normal range and within 180 days    Creatinine, Ser  Date Value Ref Range Status  06/29/2024 0.99 0.76 - 1.27 mg/dL Final         Passed - K in normal range and within 180 days    Potassium  Date Value Ref Range Status  06/29/2024 4.2 3.5 - 5.2 mmol/L Final         Passed - Patient is not pregnant      Passed - Last BP in normal range    BP Readings from Last 1 Encounters:  09/30/24 110/68         Passed - Valid encounter within last 6 months     Recent Outpatient Visits           2 months ago Type 2 diabetes mellitus with diabetic neuropathy, with long-term current use of insulin  [facility])    [facility] [place], Curtis A, FNP   5 months ago Type 2 diabetes mellitus with diabetic neuropathy, with long-term current use of insulin  [facility])   [facility] [place], Curtis LABOR, [place]

## 2024-11-29 NOTE — Telephone Encounter (Signed)
 FYI Only or Action Required?: FYI only for provider: appointment scheduled on 12/01/24.  Patient was last seen in primary care on 09/30/2024 by Patrick Curtis LABOR, FNP.  Called Nurse Triage reporting Blood Sugar Problem.  Symptoms began today.  Interventions attempted: Prescription medications: Lantus , Metformin and Dietary changes.  Symptoms are: unchanged.  Triage Disposition: Call PCP Now  Patient/caregiver understands and will follow disposition?: Yes  Reason for Disposition  [1] Blood glucose > 300 mg/dL (83.2 mmol/L) AND [7] two or more times in a row  Answer Assessment - Initial Assessment Questions Patient called in stating his blood sugar levels have been all over the place today. He states one moment it was 150, then the next it was 250. He states he did have some readings over 300 as well. He is asymptomatic, but was not sure why they are reading so high. He states he has been trying to eat foods that would not cause his sugar to spike. He mentions that he is out of Glipizide  and will be picking it up tomorrow. Advised patient to come in for office visit, appt scheduled 12/01/24. Advised to visit ED overnight if symptoms worsen. Advised to call back tomorrow if symptoms worsen or improve and he no longer needs to be seen. Pt is also asking about Insulin -wants to know if he should be taking this at night.   1. BLOOD GLUCOSE: What is your blood glucose level?      257 while on call  2. ONSET: When did you check the blood glucose?     During triage call-patient has freestyle device  3. USUAL RANGE: What is your glucose level usually? (e.g., usual fasting morning value, usual evening value)     Usually 120-260 depending on what he eats  4. KETONES: Do you check for ketones (urine or blood test strips)? If Yes, ask: What does the test show now?      No  5. TYPE 1 or 2:  Do you know what type of diabetes you have?  (e.g., Type 1, Type 2, Gestational; doesn't know)       Type 2  6. INSULIN : Do you take insulin ? What type of insulin (s) do you use? What is the mode of delivery? (syringe, pen; injection or pump)?      Lantus  25 in the morning-should he take that at night?  7. DIABETES PILLS: Do you take any pills for your diabetes? If Yes, ask: Have you missed taking any pills recently?     Metformin BID, Glipizide  BID-out of this medication, picking up tomorrow  8. OTHER SYMPTOMS: Do you have any symptoms? (e.g., fever, frequent urination, difficulty breathing, dizziness, weakness, vomiting)     No  9. PREGNANCY: Is there any chance you are pregnant? When was your last menstrual period?     NA  Protocols used: Diabetes - High Blood Sugar-A-AH

## 2024-11-29 NOTE — Telephone Encounter (Signed)
 Copied from CRM #[id number]. Topic: Clinical - Medication Refill >> Nov 26, 2024  2:32 PM Rosaria E wrote: Medication: glipiZIDE  (GLUCOTROL ) 10 MG tablet lisinopril (ZESTRIL) 5 MG tablet  Pt is completely out of his current supply.  Has the patient contacted their pharmacy? Yes (Agent: If no, request that the patient contact the pharmacy for the refill. If patient does not wish to contact the pharmacy document the reason why and proceed with request.) (Agent: If yes, when and what did the pharmacy advise?)  This is the patient's preferred pharmacy:  [facility] [address] [place] - [address] [place] [address] Phone: [phone] Fax: [phone]  Is this the correct pharmacy for this prescription? Yes If no, delete pharmacy and type the correct one.   Has the prescription been filled recently? Yes  Is the patient out of the medication? Yes.  Has the patient been seen for an appointment in the last year OR does the patient have an upcoming appointment? Yes  Can we respond through MyChart? Yes   Agent: Please be advised that Rx refills may take up to 3 business days. We ask that you follow-up with your pharmacy.

## 2024-11-29 NOTE — Telephone Encounter (Signed)
 1st attempt to contact pt - VM left  Copied from CRM #[id number]. Topic: Clinical - Red Word Triage >> Nov 29, 2024 10:52 AM Carlyon D wrote: Red Word that prompted transfer to Nurse Triage: high blood sugar pt states its all over the place

## 2024-12-01 ENCOUNTER — Ambulatory Visit: Admitting: Family Medicine

## 2024-12-01 ENCOUNTER — Other Ambulatory Visit: Payer: Self-pay | Admitting: Family Medicine

## 2024-12-01 DIAGNOSIS — E1140 Type 2 diabetes mellitus with diabetic neuropathy, unspecified: Secondary | ICD-10-CM

## 2024-12-01 NOTE — Telephone Encounter (Signed)
 Copied from CRM #[id number]. Topic: Clinical - Medication Refill >> Dec 01, 2024  9:23 AM Emylou G wrote: Medication: insulin  glargine (LANTUS  SOLOSTAR) 100 UNIT/ML Solostar Pen  Has the patient contacted their pharmacy? Yes (Agent: If no, request that the patient contact the pharmacy for the refill. If patient does not wish to contact the pharmacy document the reason why and proceed with request.) (Agent: If yes, when and what did the pharmacy advise?) said to call us   This is the patient's preferred pharmacy:  [facility] [address] [place] - [address] [place] [address] Phone: [phone] Fax: [phone]  Is this the correct pharmacy for this prescription? Yes If no, delete pharmacy and type the correct one.   Has the prescription been filled recently? No  Is the patient out of the medication? Yes  Has the patient been seen for an appointment in the last year OR does the patient have an upcoming appointment? Yes  Can we respond through MyChart? No  Agent: Please be advised that Rx refills may take up to 3 business days. We ask that you follow-up with your pharmacy.

## 2024-12-02 NOTE — Telephone Encounter (Signed)
 Requested Prescriptions  Refused Prescriptions Disp Refills   insulin  glargine (LANTUS  SOLOSTAR) 100 UNIT/ML Solostar Pen 22.5 mL 3    Sig: Inject 25 Units into the skin daily.     Endocrinology:  Diabetes - Insulins Passed - 12/02/2024  5:51 PM      Passed - HBA1C is between 0 and 7.9 and within 180 days    Hemoglobin A1C  Date Value Ref Range Status  09/30/2024 6.9 (A) 4.0 - 5.6 % Final   Hgb A1c MFr Bld  Date Value Ref Range Status  06/29/2024 6.7 (H) 4.8 - 5.6 % Final    Comment:             Prediabetes: 5.7 - 6.4          Diabetes: >6.4          Glycemic control for adults with diabetes: <7.0          Passed - Valid encounter within last 6 months    Recent Outpatient Visits           2 months ago Type 2 diabetes mellitus with diabetic neuropathy, with long-term current use of insulin  [facility])   [place] [facility] [place], Curtis A, FNP   5 months ago Type 2 diabetes mellitus with diabetic neuropathy, with long-term current use of insulin  [facility])   [facility] [place], Curtis LABOR, [place]

## 2024-12-02 NOTE — Telephone Encounter (Signed)
 Called pharmacy pt has refills available - pharmacy will prepare

## 2024-12-29 ENCOUNTER — Other Ambulatory Visit: Payer: Self-pay

## 2024-12-29 NOTE — Telephone Encounter (Signed)
 Copied from CRM [id number]. Topic: Clinical - Medication Refill >> Dec 29, 2024  9:43 AM Everette C wrote: Medication: glipiZIDE  (GLUCOTROL ) 10 MG tablet [[id number]]  Continuous Glucose Sensor (FREESTYLE LIBRE 3 SENSOR) MISC [[id number]]  Has the patient contacted their pharmacy? Yes (Agent: If no, request that the patient contact the pharmacy for the refill. If patient does not wish to contact the pharmacy document the reason why and proceed with request.) (Agent: If yes, when and what did the pharmacy advise?)  This is the patient's preferred pharmacy:  [facility] [address], [place] - [address] [place] [address] Phone: [phone] Fax: [phone]  Is this the correct pharmacy for this prescription? Yes If no, delete pharmacy and type the correct one.   Has the prescription been filled recently? Yes  Is the patient out of the medication? Yes  Has the patient been seen for an appointment in the last year OR does the patient have an upcoming appointment? Yes  Can we respond through MyChart? No  Agent: Please be advised that Rx refills may take up to 3 business days. We ask that you follow-up with your pharmacy.

## 2024-12-30 MED ORDER — FREESTYLE LIBRE 3 SENSOR MISC: 2.0000 | 1 refills | Status: DC

## 2024-12-30 MED ORDER — GLIPIZIDE 10 MG PO TABS: 10.0000 mg | ORAL_TABLET | Freq: Two times a day (BID) | ORAL | 0 refills | Status: DC

## 2024-12-30 NOTE — Telephone Encounter (Signed)
 Requested Prescriptions  Pending Prescriptions Disp Refills   Continuous Glucose Sensor (FREESTYLE LIBRE 3 SENSOR) MISC 6 each 1    Sig: 2 each by Other route every 14 (fourteen) days.     Endocrinology: Diabetes - Testing Supplies Passed - 12/30/2024 12:51 PM      Passed - Valid encounter within last 12 months    Recent Outpatient Visits           3 months ago Type 2 diabetes mellitus with diabetic neuropathy, with long-term current use of insulin  [facility])   [facility] [place], Curtis A, FNP   6 months ago Type 2 diabetes mellitus with diabetic neuropathy, with long-term current use of insulin  [facility])   [facility], [place] A, FNP               glipiZIDE  (GLUCOTROL ) 10 MG tablet 180 tablet 0    Sig: Take 1 tablet (10 mg total) by mouth 2 (two) times daily.     Endocrinology:  Diabetes - Sulfonylureas Passed - 12/30/2024 12:51 PM      Passed - HBA1C is between 0 and 7.9 and within 180 days    Hemoglobin A1C  Date Value Ref Range Status  09/30/2024 6.9 (A) 4.0 - 5.6 % Final   Hgb A1c MFr Bld  Date Value Ref Range Status  06/29/2024 6.7 (H) 4.8 - 5.6 % Final    Comment:             Prediabetes: 5.7 - 6.4          Diabetes: >6.4          Glycemic control for adults with diabetes: <7.0          Passed - Cr in normal range and within 360 days    Creatinine, Ser  Date Value Ref Range Status  06/29/2024 0.99 0.76 - 1.27 mg/dL Final         Passed - Valid encounter within last 6 months    Recent Outpatient Visits           3 months ago Type 2 diabetes mellitus with diabetic neuropathy, with long-term current use of insulin  [facility])   [facility] [place], Curtis A, FNP   6 months ago Type 2 diabetes mellitus with diabetic neuropathy, with long-term current use of insulin  [facility])   [facility] [place], Curtis LABOR, [place]

## 2024-12-31 ENCOUNTER — Ambulatory Visit

## 2025-01-03 ENCOUNTER — Other Ambulatory Visit: Payer: Self-pay

## 2025-01-03 DIAGNOSIS — R200 Anesthesia of skin: Secondary | ICD-10-CM

## 2025-01-03 DIAGNOSIS — E1140 Type 2 diabetes mellitus with diabetic neuropathy, unspecified: Secondary | ICD-10-CM

## 2025-01-03 MED ORDER — GABAPENTIN 100 MG PO CAPS: 100.0000 mg | ORAL_CAPSULE | Freq: Three times a day (TID) | ORAL | 3 refills | Status: AC

## 2025-01-03 MED ORDER — DEXCOM G7 SENSOR MISC: 3 refills | Status: DC

## 2025-01-03 NOTE — Telephone Encounter (Signed)
 Copied from CRM #[id number]. Topic: Clinical - Medication Refill >> Jan 03, 2025  1:31 PM [name] wrote: Medication:  gabapentin  (NEURONTIN ) 100 MG capsule   Has the patient contacted their pharmacy? Yes, states no refill available  (Agent: If no, request that the patient contact the pharmacy for the refill. If patient does not wish to contact the pharmacy document the reason why and proceed with request.) (Agent: If yes, when and what did the pharmacy advise?)  This is the patient's preferred pharmacy:  [facility] [address], [place] - [address] [place] [address] Phone: [phone] Fax: [phone]  Is this the correct pharmacy for this prescription? Yes If no, delete pharmacy and type the correct one.   Has the prescription been filled recently? No  Is the patient out of the medication? Yes  Has the patient been seen for an appointment in the last year OR does the patient have an upcoming appointment? Yes  Can we respond through MyChart? No  Agent: Please be advised that Rx refills may take up to 3 business days. We ask that you follow-up with your pharmacy.

## 2025-01-07 ENCOUNTER — Other Ambulatory Visit: Payer: Self-pay

## 2025-01-07 DIAGNOSIS — E1140 Type 2 diabetes mellitus with diabetic neuropathy, unspecified: Secondary | ICD-10-CM

## 2025-01-07 NOTE — Telephone Encounter (Signed)
 Requested medications are due for refill today.  See note  Requested medications are on the active medications list.  yes  Last refill. 01/07/2025  Future visit scheduled.   yes  Notes to clinic.  Pharmacy comment: prior auth required.     Requested Prescriptions  Pending Prescriptions Disp Refills   Continuous Glucose Sensor (DEXCOM G7 SENSOR) MISC [Pharmacy Med Name: DEXCOM G7 SENSOR    MIS] 9 each 3    Sig: USE TO CHECK BLOOD SUGAR AS NBEEDED FOR INSULIN  DEPENDENT TYPE 2 DIABETES     Endocrinology: Diabetes - Testing Supplies Passed - 01/07/2025  5:30 PM      Passed - Valid encounter within last 12 months    Recent Outpatient Visits           3 months ago Type 2 diabetes mellitus with diabetic neuropathy, with long-term current use of insulin  [facility])   [place] [place], Curtis A, FNP   6 months ago Type 2 diabetes mellitus with diabetic neuropathy, with long-term current use of insulin  [facility])   [facility] [place], Curtis LABOR, [place]

## 2025-01-10 ENCOUNTER — Telehealth: Payer: Self-pay

## 2025-01-10 ENCOUNTER — Other Ambulatory Visit (HOSPITAL_COMMUNITY): Payer: Self-pay

## 2025-01-10 ENCOUNTER — Other Ambulatory Visit: Payer: Self-pay

## 2025-01-10 DIAGNOSIS — E1140 Type 2 diabetes mellitus with diabetic neuropathy, unspecified: Secondary | ICD-10-CM

## 2025-01-10 MED ORDER — DEXCOM G7 SENSOR MISC: 3 refills | Status: AC

## 2025-01-10 NOTE — Telephone Encounter (Signed)
 Copied from CRM #[id number]. Topic: Clinical - Medication Question >> Jan 03, 2025  1:39 PM [name] wrote: Reason for CRM: Pt states his his INS changed from [employer] to [facility]. States he uses the [employer] but now his new ins does not cover that, now it covers Dexcom. He has two more days with the freestyle and was hoping the Dexcom could be sent to the [place] pharmacy listed on his file so his insurance can cover. Requesting call back [phone]. >> Jan 07, 2025  6:07 PM [name] wrote: After reviewing chart and reported error. Lead Specialist called patient and left him a voicemail informing him to pick up his Dexcom machine at his local [employer]. Please relay information to patient upon callback.   Callback number: [phone]  >> Jan 06, 2025 11:46 AM [name] wrote: Patient called saying he needs the Dexcom per his insurance.  He does have anything to check his blood sugar with. >> Jan 03, 2025  2:28 PM CMA [name] wrote: Reason for CRM: Pt states his his INS changed from [employer] to [employer]. States he uses the [employer] but now his new ins does not cover that, now it covers Dexcom. He has two more days with the freestyle and was hoping the Dexcom could be sent to the [place] pharmacy listed on his  file so his insurance can cover. Requesting call back [phone]

## 2025-01-10 NOTE — Telephone Encounter (Signed)
 Pharmacy Patient Advocate Encounter   Received notification from Physician's Office that prior authorization for Dexcom G7 Sensor is required/requested.   Insurance verification completed.   The patient is insured through [facility] [employer] [facility].   Per test claim: PA required; PA submitted to above mentioned insurance via Latent Key/confirmation #/[facility] Status is pending

## 2025-01-10 NOTE — Telephone Encounter (Signed)
 Patient states pharmacist told him this needed to have PA    Copied from CRM #[id number]. Topic: Clinical - Medication Question >> Jan 03, 2025  1:39 PM [name] wrote: Reason for CRM: Pt states his his INS changed from [employer] to [facility] health plan. States he uses the [employer] but now his new ins does not cover that, now it covers Dexcom. He has two more days with the freestyle and was hoping the Dexcom could be sent to the [place] pharmacy listed on his file so his insurance can cover. Requesting call back [phone]. >> Jan 10, 2025  3:02 PM Mercedes MATSU wrote: Patient is requesting a call back and can be reached at [phone]. Its in regards to his PA on a medication. He said he has been calling since last week. >> Jan 07, 2025  6:07 PM [name] wrote: After reviewing chart and reported error. Lead Specialist called patient and left him a voicemail informing him to pick up his Dexcom machine at his local [employer]. Please relay information to patient upon callback.   Callback number: [phone]  >> Jan 06, 2025 11:46 AM [name] wrote: Patient called saying he needs the Dexcom per his insurance.  He does have anything to check his blood sugar with. >> Jan 03, 2025  2:28 PM CMA [name] wrote: Reason for CRM: Pt states his his INS changed from [employer] to [employer]. States he uses the [employer] but now his new ins does not cover that, now it covers Dexcom. He has two more days with the freestyle and was hoping the Dexcom could be sent to the [place] pharmacy listed on his  file so his insurance can cover. Requesting call back [phone]

## 2025-01-11 ENCOUNTER — Other Ambulatory Visit (HOSPITAL_COMMUNITY): Payer: Self-pay

## 2025-01-11 NOTE — Telephone Encounter (Signed)
 Pharmacy Patient Advocate Encounter  Received notification from [facility] that Prior Authorization for  Dexcom G7 Sensor has been APPROVED from 01/11/2025 to 01/11/2026   PA #/Case ID/Reference #: [id number]

## 2025-01-11 NOTE — Telephone Encounter (Signed)
 Patient advised

## 2025-01-12 ENCOUNTER — Other Ambulatory Visit (HOSPITAL_COMMUNITY): Payer: Self-pay

## 2025-01-12 ENCOUNTER — Telehealth: Payer: Self-pay

## 2025-01-12 ENCOUNTER — Ambulatory Visit (INDEPENDENT_AMBULATORY_CARE_PROVIDER_SITE_OTHER)

## 2025-01-12 VITALS — BP 145/81 | HR 76 | Ht 66.0 in

## 2025-01-12 DIAGNOSIS — E1140 Type 2 diabetes mellitus with diabetic neuropathy, unspecified: Secondary | ICD-10-CM | POA: Diagnosis not present

## 2025-01-12 DIAGNOSIS — I152 Hypertension secondary to endocrine disorders: Secondary | ICD-10-CM | POA: Diagnosis not present

## 2025-01-12 DIAGNOSIS — Z794 Long term (current) use of insulin: Secondary | ICD-10-CM

## 2025-01-12 DIAGNOSIS — E1159 Type 2 diabetes mellitus with other circulatory complications: Secondary | ICD-10-CM

## 2025-01-12 DIAGNOSIS — E11319 Type 2 diabetes mellitus with unspecified diabetic retinopathy without macular edema: Secondary | ICD-10-CM | POA: Insufficient documentation

## 2025-01-12 LAB — POCT GLYCOSYLATED HEMOGLOBIN (HGB A1C): Hemoglobin A1C: 7.6 % — AB (ref 4.0–5.6)

## 2025-01-12 MED ORDER — METFORMIN HCL 500 MG PO TABS: 500.0000 mg | ORAL_TABLET | Freq: Two times a day (BID) | ORAL | 3 refills | Status: DC

## 2025-01-12 MED ORDER — GLIPIZIDE 10 MG PO TABS: 10.0000 mg | ORAL_TABLET | Freq: Two times a day (BID) | ORAL | 3 refills | Status: AC

## 2025-01-12 MED ORDER — LANTUS SOLOSTAR 100 UNIT/ML ~~LOC~~ SOPN: 30.0000 [IU] | PEN_INJECTOR | Freq: Every day | SUBCUTANEOUS | 3 refills | Status: AC

## 2025-01-12 MED ORDER — METFORMIN HCL 500 MG PO TABS: 1000.0000 mg | ORAL_TABLET | Freq: Two times a day (BID) | ORAL | 3 refills | Status: AC

## 2025-01-12 NOTE — Progress Notes (Unsigned)
 Care Guide Pharmacy Note  01/12/2025 Name: Patrick Ramos MRN: [id number] DOB: 08-28-1961  Referred By: Franchot Isaiah LABOR, MD Reason for referral: Complex Care Management and Call Attempt #1 (Unsuccessful initial outreach to schedule with PHARM D- Allyson)   [name] is a 64 y.o. year old male who is a primary care patient of Franchot Isaiah LABOR, MD.  Ozell FORBES Rakers was referred to the pharmacist for assistance related to: DMII  An unsuccessful telephone outreach was attempted today to contact the patient who was referred to the pharmacy team for assistance with Disease management. Additional attempts will be made to contact the patient.  Leotis [facility] Guide  Direct Dial: [phone]  Fax [phone]

## 2025-01-12 NOTE — Progress Notes (Signed)
 "     Established patient visit   Patient: Patrick Ramos   DOB: 31-Mar-1961   64 y.o. Male  MRN: [id number] Visit Date: 01/12/2025  Today's healthcare provider: Isaiah DELENA Pepper, MD   Chief Complaint  Patient presents with   Medical Management of Chronic Issues    Declined pneumococcal, zoster and hepatitis injections Eye Exam completed at [facility] // Records requested   Diabetes   Hypertension   Subjective    HPI  Discussed the use of AI scribe software for clinical note transcription with the patient, who gave verbal consent to proceed.  History of Present Illness Patrick Ramos is a 64 year old male with hypertension and diabetes who presents for follow-up on blood pressure and diabetes management.  He has difficulty regulating his diabetes and has developed diabetic retinopathy. Despite receiving injections for the past couple of months, he reports that his ophthalmologist said his eyes were still getting worse. His last A1c was less than 7, but he is concerned about current blood sugar control. He recently switched from [facility] to Dexcom for continuous glucose monitoring due to insurance coverage issues and has been without sensors for about a week and a half to two weeks.  His current diabetes medications include metformin  1000 mg twice daily, insulin  25 units in the morning, and glipizide  10 mg twice daily. He ran out of glipizide  for a few days a couple of weeks ago, which may have affected his blood sugar control. He has experienced low blood sugar episodes in the past, with readings as low as 55. He is trying to manage his diet by reducing intake of high-carb foods like potatoes, rice, and bread.  Regarding hypertension, he notes that his blood pressure was high today, possibly due to a large cup of coffee and poor sleep the previous night. He takes lisinopril  and metoprolol. He checks his blood pressure at home.  His social history includes a recent  [place]. He drinks flavored water and diet drinks, and occasionally coffee. He is not a regular coffee drinker but has been drinking it due to cold weather. He mentions the financial burden of managing diabetes, particularly the cost of injections for his eyes.   Medications: Show/hide medication list[1]  Review of Systems as noted in HPI.      Objective    BP (!) 145/81 (BP Location: Left Arm, Patient Position: Sitting, Cuff Size: Normal)   Pulse 76   Ht 5' 6 (1.676 m)   SpO2 100%   BMI 27.45 kg/m    Physical Exam Constitutional:      Appearance: Normal appearance.  HENT:     Head: Normocephalic and atraumatic.     Mouth/Throat:     Mouth: Mucous membranes are moist.  Eyes:     Pupils: Pupils are equal, round, and reactive to light.  Pulmonary:     Effort: Pulmonary effort is normal.  Skin:    General: Skin is warm.  Neurological:     General: No focal deficit present.     Mental Status: He is alert.      Results for orders placed or performed in visit on 01/12/25  POCT glycosylated hemoglobin (Hb A1C)  Result Value Ref Range   Hemoglobin A1C 7.6 (A) 4.0 - 5.6 %   HbA1c POC (<> result, manual entry)     HbA1c, POC (prediabetic range)     HbA1c, POC (controlled diabetic range)  Assessment & Plan     Problem List Items Addressed This Visit       Cardiovascular and Mediastinum   Hypertension associated with diabetes (HCC)   Relevant Medications   glipiZIDE  (GLUCOTROL ) 10 MG tablet   metFORMIN  (GLUCOPHAGE ) 500 MG tablet   insulin  glargine (LANTUS  SOLOSTAR) 100 UNIT/ML Solostar Pen     Endocrine   Type 2 diabetes mellitus with diabetic neuropathy, with long-term current use of insulin  (HCC) - Primary   Relevant Medications   glipiZIDE  (GLUCOTROL ) 10 MG tablet   metFORMIN  (GLUCOPHAGE ) 500 MG tablet   insulin  glargine (LANTUS  SOLOSTAR) 100 UNIT/ML Solostar Pen   Other Relevant Orders   POCT glycosylated hemoglobin (Hb A1C)  (Completed)   Microalbumin / creatinine urine ratio   Referral to Nutrition and Diabetes Services   AMB Referral VBCI Care Management   Diabetic retinopathy (HCC)   Relevant Medications   glipiZIDE  (GLUCOTROL ) 10 MG tablet   metFORMIN  (GLUCOPHAGE ) 500 MG tablet   insulin  glargine (LANTUS  SOLOSTAR) 100 UNIT/ML Solostar Pen   Assessment & Plan Type 2 diabetes mellitus with diabetic neuropathy and retinopathy, with long-term current use of insulin  (HCC) Suboptimal diabetes management with A1c of 7.6. Worsening retinopathy despite injections. Neuropathy with occasional foot numbness. Interested in dietary guidance. Would be a good candidate for a GLP-1, but concerned about cost. - Increased insulin  to 30 units nightly. Discussed signs of hypoglycemia with patient. - Continue metformin  500 mg, two tablets twice daily. - Continue glipizide  10 mg twice daily. - Referred to clinical pharmacist for evaluation of potential addition of Ozempic or Mounjaro. - Referred to nutritionist for dietary guidance. - Provided Dexcom samples and instructed on app usage. - Consider starting statin, previously declined  Hypertension associated with diabetes (HCC) Elevated blood pressure possibly due to caffeine and poor sleep. Previous BP readings were normal. Current medications include lisinopril  and metoprolol. - Monitor blood pressure at home. - If home blood pressure is consistently greater than 130/80, will consider increasing lisinopril .    BP Readings from Last 3 Encounters:  01/12/25 (!) 145/81  09/30/24 110/68  06/29/24 (!) 126/59     Return in about 3 months (around 04/12/2025) for Follow Up.       Isaiah DELENA Pepper, MD  [facility] [phone] (phone) [phone] (fax)     [1]  Outpatient Medications Prior to Visit  Medication Sig   aspirin EC 81 MG tablet Take 81 mg by mouth daily. Swallow whole.   Continuous Glucose Sensor (DEXCOM G7 SENSOR) MISC Use  to check blood sugar as needed for insulin  dependent type 2 diabetes   gabapentin  (NEURONTIN ) 100 MG capsule Take 1 capsule (100 mg total) by mouth 3 (three) times daily.   metoprolol tartrate (LOPRESSOR) 25 MG tablet Take 25 mg by mouth at bedtime.   [DISCONTINUED] glipiZIDE  (GLUCOTROL ) 10 MG tablet Take 1 tablet (10 mg total) by mouth 2 (two) times daily.   [DISCONTINUED] insulin  glargine (LANTUS  SOLOSTAR) 100 UNIT/ML Solostar Pen Inject 25 Units into the skin daily.   lisinopril  (ZESTRIL ) 5 MG tablet Take 1 tablet (5 mg total) by mouth daily.   [DISCONTINUED] metFORMIN  (GLUCOPHAGE ) 500 MG tablet Take 500 mg by mouth 2 (two) times daily with a meal.   No facility-administered medications prior to visit.   "

## 2025-01-13 LAB — MICROALBUMIN / CREATININE URINE RATIO
Creatinine, Urine: 52.3 mg/dL
Microalb/Creat Ratio: <6 mg/g{creat} (ref 0–29)
Microalbumin, Urine: <3 ug/mL

## 2025-01-13 LAB — SPECIMEN STATUS REPORT

## 2025-01-13 NOTE — Progress Notes (Signed)
 Care Guide Pharmacy Note  01/13/2025 Name: Patrick Ramos MRN: [id number] DOB: 10-Mar-1961  Referred By: Franchot Isaiah LABOR, MD Reason for referral: Complex Care Management, Call Attempt #1 (Unsuccessful initial outreach to schedule with PHARM DGLENWOOD Keeling), Call Attempt #2 (Unsuccessful initial outreach to schedule with PHARM D- Allyson), and Call Attempt #3 (Successful initial outreach scheduled with PHARM D- Allyson)   Patrick Ramos is a 64 y.o. year old male who is a primary care patient of Franchot Isaiah LABOR, MD.  Patrick Ramos was referred to the pharmacist for assistance related to: DMII  Successful contact was made with the patient to discuss pharmacy services including being ready for the pharmacist to call at least 5 minutes before the scheduled appointment time and to have medication bottles and any blood pressure readings ready for review. The patient agreed to meet with the pharmacist via In office on (date/time). 01/27/25 @ 3 PM  Patrick Ramos [facility] [address] [facility] Guide  Direct Dial: [phone]  Fax [phone]

## 2025-01-13 NOTE — Progress Notes (Signed)
 Care Guide Pharmacy Note  01/13/2025 Name: SEVAG SHEARN MRN: [id number] DOB: 15-Aug-1961  Referred By: Franchot Isaiah LABOR, MD Reason for referral: Complex Care Management, Call Attempt #1 (Unsuccessful initial outreach to schedule with PHARM D- Allyson), and Call Attempt #2 (Unsuccessful initial outreach to schedule with PHARM D- Allyson)   WINFERD WEASE is a 64 y.o. year old male who is a primary care patient of Franchot Isaiah LABOR, MD.  Ozell FORBES Rakers was referred to the pharmacist for assistance related to: DMII  A second unsuccessful telephone outreach was attempted today to contact the patient who was referred to the pharmacy team for assistance with Disease management. Additional attempts will be made to contact the patient.  Leotis Rase [facility], [facility] Guide  Direct Dial: [phone]  Fax [phone]

## 2025-01-27 ENCOUNTER — Ambulatory Visit

## 2025-01-27 ENCOUNTER — Telehealth: Payer: Self-pay

## 2025-01-27 NOTE — Progress Notes (Unsigned)
 "  S:     Reason for visit: ?  Patrick Ramos is a 64 y.o. male with a history of diabetes (type 2), who presents today for {lzvisittype:31254} diabetes {visit type:33775} pharmacotherapy visit.? Pertinent PMH also includes ***.  They were referred to the pharmacist by {referredtopharmacy:27270} for assistance in managing {referralreason:27271}.  Known DM Complications: {DM complications:33329}   Care Team: Primary Care Provider: Franchot Isaiah LABOR, MD  At last visit, ***.   Patient reports Diabetes was diagnosed in ***.   Current diabetes medications include: *** Previous diabetes medications include: *** Current hypertension medications include: *** Current hyperlipidemia medications include: ***  Patient reports adherence to taking all medications as prescribed.  *** Patient denies adherence with medications, reports missing *** medications *** times per week, on average.  Have you been experiencing any side effects to the medications prescribed? {YES NO:22349} Do you have any problems obtaining medications due to transportation or finances? {YES [id number] Insurance coverage: ***  Current medication access support: ***  Patient {Actions; denies-reports:120008} hypoglycemic events.  Reported home fasting blood sugars: ***  Reported 2 hour post-meal/random blood sugars: ***.  Patient {Actions; denies-reports:120008} nocturia (nighttime urination).  Patient {Actions; denies-reports:120008} neuropathy (nerve pain). Patient {Actions; denies-reports:120008} visual changes. Patient {Actions; denies-reports:120008} self foot exams.   Patient reported dietary habits: Eats *** meals/day Breakfast: *** Lunch: *** Dinner: *** Snacks: *** Drinks: ***  Patient-reported exercise habits: *** DM Prevention:  Statin: {Blank single:19197::***,Taking,Not taking,Intolerant to,Declines}; {Blank single:19197::low intensity,moderate intensity,high intensity,n/a}.?   ACE/ARB: {Blank single:19197::yes,no}; *** History of chronic kidney disease? {Blank single:19197::yes,no} Last urinary albumin/creatinine ratio:  Lab Results  Component Value Date   MICRALBCREAT <6 01/12/2025   MICRALBCREAT <4 06/29/2024   Last eye exam:     Last foot exam: No foot exam found Tobacco Use:  Tobacco Use: High Risk (01/12/2025)   Patient History    Smoking Tobacco Use: Never    Smokeless Tobacco Use: Current    Passive Exposure: Not on file   O:  {CGM reports:33570} 7 day average blood glucose: ***  Libre3 CGM Download today *** % Time CGM is active: ***% Average Glucose: *** mg/dL Glucose Management Indicator: ***  Glucose Variability: ***% (goal <36%) Time in Goal:  - Time in range 70-180: ***% - Time above range: ***% - Time below range: ***% Observed patterns:  Vitals:  Wt Readings from Last 3 Encounters:  09/30/24 170 lb 1.6 oz (77.2 kg)  06/29/24 170 lb (77.1 kg)  12/10/23 155 lb (70.3 kg)   BP Readings from Last 3 Encounters:  01/12/25 (!) 145/81  09/30/24 110/68  06/29/24 (!) 126/59   Pulse Readings from Last 3 Encounters:  01/12/25 76  09/30/24 63  06/29/24 (!) 56     Labs:?  Lab Results  Component Value Date   HGBA1C 7.6 (A) 01/12/2025   HGBA1C 6.9 (A) 09/30/2024   HGBA1C 6.7 (H) 06/29/2024   GLUCOSE 147 (H) 06/29/2024   MICRALBCREAT <6 01/12/2025   MICRALBCREAT <4 06/29/2024   CREATININE 0.99 06/29/2024   CREATININE 0.83 03/31/2021   CREATININE 0.96 04/30/2019    Lab Results  Component Value Date   CHOL 171 06/29/2024   LDLCALC 96 06/29/2024   HDL 44 06/29/2024   TRIG 181 (H) 06/29/2024   ALT 48 (H) 06/29/2024   ALT 46 (H) 03/31/2021   AST 56 (H) 06/29/2024   AST 45 (H) 03/31/2021      Chemistry      Component Value Date/Time   NA 137 06/29/2024  1024   K 4.2 06/29/2024 1024   CL 104 06/29/2024 1024   CO2 20 06/29/2024 1024   BUN 11 06/29/2024 1024   CREATININE 0.99 06/29/2024 1024      Component  Value Date/Time   CALCIUM 9.0 06/29/2024 1024   ALKPHOS 80 06/29/2024 1024   AST 56 (H) 06/29/2024 1024   ALT 48 (H) 06/29/2024 1024   BILITOT 0.8 06/29/2024 1024       The 10-year ASCVD risk score (Arnett DK, et al., 2019) is: 27%  Lab Results  Component Value Date   MICRALBCREAT <6 01/12/2025   MICRALBCREAT <4 06/29/2024    A/P: Diabetes currently *** with a most recent A1c of *** on ***, which is {DESC; UP/DOWN/UNCHANGED:18711} from *** on ***. Patient is *** able to verbalize appropriate hypoglycemia management plan. Medication adherence appears ***. Control is suboptimal due to ***. -{Meds adjust:18428} basal insulin  {basal insulins:33573}  *** units daily.  -{Meds adjust:18428} rapid insulin  {bolus insulin :33574} ***.  -{Meds adjust:18428} GLP-1 {GLP1 options:33572} *** mg .  -{Meds adjust:18428} SGLT2-I {SGLT2i options:33571}*** mg daily.  -{Meds adjust:18428} metformin  ***.  -Patient educated on purpose, proper use, and potential adverse effects of ***.  -Extensively discussed pathophysiology of diabetes, recommended lifestyle interventions, dietary effects on blood sugar control.  -Counseled on s/sx of and management of hypoglycemia.  -Next A1c anticipated ***.   ASCVD risk - {primary/secondary:33575} prevention in patient with diabetes. Last LDL is *** mg/dL, not at goal of <29 *** mg/dL. ASCVD risk factors include *** and 10-year ASCVD risk score of ***. {Desc; low/moderate/high:110033} intensity statin indicated.  -{Meds adjust:18428} {statin therapies:33576} *** mg daily.  -{Meds adjust:18428} ezetimibe 10 mg daily   Hypertension longstanding *** currently ***. Blood pressure goal of <130/80 *** mmHg. Medication adherence ***. Blood pressure control is suboptimal due to ***. -{Meds adjust:18428} *** mg ***.  {pharmacisttime:33368}  Follow-up:  Pharmacist on *** PCP clinic visit on ***  Peyton CHARLENA Ferries, PharmD, BCACP, CPP Clinical Pharmacist [facility] [phone]   "

## 2025-01-27 NOTE — Progress Notes (Signed)
 Complex Care Management Care Guide Note  01/27/2025 Name: JESHUA RANSFORD MRN: [id number] DOB: Apr 06, 1961  Ozell FORBES Rakers is a 64 y.o. year old male who is a primary care patient of Franchot Isaiah LABOR, MD and is actively engaged with the care management team. I reached out to Ozell FORBES Rakers by phone today to assist with re-scheduling  with the Pharmacist.  Follow up plan: Unsuccessful telephone outreach attempt made. A HIPAA compliant phone message was left for the patient providing contact information and requesting a return call.  Leotis Rase [facility], [facility] Guide  Direct Dial: [phone]  Fax [phone]

## 2025-04-15 ENCOUNTER — Ambulatory Visit
